# Patient Record
Sex: Male | Born: 1965 | Race: White | Hispanic: No | Marital: Married | Smoking: Never smoker
Health system: Southern US, Community
[De-identification: ages and names within clinical notes are randomized; demographics above are authoritative.]

## PROBLEM LIST (undated history)

## (undated) DIAGNOSIS — I10 Essential (primary) hypertension: Secondary | ICD-10-CM

## (undated) DIAGNOSIS — F988 Other specified behavioral and emotional disorders with onset usually occurring in childhood and adolescence: Secondary | ICD-10-CM

## (undated) DIAGNOSIS — M109 Gout, unspecified: Secondary | ICD-10-CM

---

## 2009-12-04 ENCOUNTER — Emergency Department (HOSPITAL_COMMUNITY): Admission: EM | Admit: 2009-12-04 | Discharge: 2009-12-04 | Payer: Self-pay | Admitting: Emergency Medicine

## 2010-10-04 LAB — COMPREHENSIVE METABOLIC PANEL
BUN: 21 mg/dL (ref 6–23)
CO2: 27 mEq/L (ref 19–32)
Calcium: 9.2 mg/dL (ref 8.4–10.5)
Chloride: 103 mEq/L (ref 96–112)
Potassium: 2.8 mEq/L — ABNORMAL LOW (ref 3.5–5.1)
Sodium: 139 mEq/L (ref 135–145)
Total Protein: 7.6 g/dL (ref 6.0–8.3)

## 2010-10-04 LAB — DIFFERENTIAL
Basophils Absolute: 0 10*3/uL (ref 0.0–0.1)
Basophils Relative: 0 % (ref 0–1)
Eosinophils Relative: 4 % (ref 0–5)
Lymphocytes Relative: 35 % (ref 12–46)
Lymphs Abs: 3.3 10*3/uL (ref 0.7–4.0)
Monocytes Relative: 10 % (ref 3–12)
Neutro Abs: 4.8 10*3/uL (ref 1.7–7.7)

## 2010-10-04 LAB — CBC
HCT: 41.3 % (ref 39.0–52.0)
Hemoglobin: 14.5 g/dL (ref 13.0–17.0)
MCHC: 35.1 g/dL (ref 30.0–36.0)
MCV: 88.3 fL (ref 78.0–100.0)

## 2010-10-04 LAB — URINALYSIS, ROUTINE W REFLEX MICROSCOPIC
Bilirubin Urine: NEGATIVE
Hgb urine dipstick: NEGATIVE
Nitrite: NEGATIVE
Protein, ur: NEGATIVE mg/dL
pH: 5.5 (ref 5.0–8.0)

## 2010-10-04 LAB — LIPASE, BLOOD: Lipase: 34 U/L (ref 11–59)

## 2015-10-16 ENCOUNTER — Other Ambulatory Visit: Payer: Self-pay | Admitting: Family Medicine

## 2015-10-16 ENCOUNTER — Ambulatory Visit
Admission: RE | Admit: 2015-10-16 | Discharge: 2015-10-16 | Disposition: A | Discharge status: Home / Self Care | Payer: BLUE CROSS/BLUE SHIELD | Source: Ambulatory Visit | Attending: Family Medicine | Admitting: Family Medicine

## 2015-10-16 DIAGNOSIS — N5089 Other specified disorders of the male genital organs: Secondary | ICD-10-CM

## 2015-12-10 DIAGNOSIS — F341 Dysthymic disorder: Secondary | ICD-10-CM | POA: Diagnosis not present

## 2015-12-18 DIAGNOSIS — Z79899 Other long term (current) drug therapy: Secondary | ICD-10-CM | POA: Diagnosis not present

## 2015-12-18 DIAGNOSIS — E782 Mixed hyperlipidemia: Secondary | ICD-10-CM | POA: Diagnosis not present

## 2015-12-18 DIAGNOSIS — R8299 Other abnormal findings in urine: Secondary | ICD-10-CM | POA: Diagnosis not present

## 2015-12-18 DIAGNOSIS — I10 Essential (primary) hypertension: Secondary | ICD-10-CM | POA: Diagnosis not present

## 2016-01-08 DIAGNOSIS — F341 Dysthymic disorder: Secondary | ICD-10-CM | POA: Diagnosis not present

## 2016-03-01 DIAGNOSIS — F341 Dysthymic disorder: Secondary | ICD-10-CM | POA: Diagnosis not present

## 2016-03-10 DIAGNOSIS — M109 Gout, unspecified: Secondary | ICD-10-CM | POA: Diagnosis not present

## 2016-03-10 DIAGNOSIS — M79672 Pain in left foot: Secondary | ICD-10-CM | POA: Diagnosis not present

## 2016-03-10 DIAGNOSIS — R030 Elevated blood-pressure reading, without diagnosis of hypertension: Secondary | ICD-10-CM | POA: Diagnosis not present

## 2016-03-10 DIAGNOSIS — F341 Dysthymic disorder: Secondary | ICD-10-CM | POA: Diagnosis not present

## 2016-03-22 DIAGNOSIS — F341 Dysthymic disorder: Secondary | ICD-10-CM | POA: Diagnosis not present

## 2016-04-05 DIAGNOSIS — F341 Dysthymic disorder: Secondary | ICD-10-CM | POA: Diagnosis not present

## 2016-04-07 DIAGNOSIS — F341 Dysthymic disorder: Secondary | ICD-10-CM | POA: Diagnosis not present

## 2016-04-20 DIAGNOSIS — F900 Attention-deficit hyperactivity disorder, predominantly inattentive type: Secondary | ICD-10-CM | POA: Diagnosis not present

## 2016-04-20 DIAGNOSIS — F341 Dysthymic disorder: Secondary | ICD-10-CM | POA: Diagnosis not present

## 2016-04-28 DIAGNOSIS — F900 Attention-deficit hyperactivity disorder, predominantly inattentive type: Secondary | ICD-10-CM | POA: Diagnosis not present

## 2016-04-28 DIAGNOSIS — F341 Dysthymic disorder: Secondary | ICD-10-CM | POA: Diagnosis not present

## 2016-05-04 DIAGNOSIS — F341 Dysthymic disorder: Secondary | ICD-10-CM | POA: Diagnosis not present

## 2016-05-04 DIAGNOSIS — F900 Attention-deficit hyperactivity disorder, predominantly inattentive type: Secondary | ICD-10-CM | POA: Diagnosis not present

## 2016-05-16 DIAGNOSIS — F341 Dysthymic disorder: Secondary | ICD-10-CM | POA: Diagnosis not present

## 2016-05-16 DIAGNOSIS — F419 Anxiety disorder, unspecified: Secondary | ICD-10-CM | POA: Diagnosis not present

## 2016-05-16 DIAGNOSIS — F900 Attention-deficit hyperactivity disorder, predominantly inattentive type: Secondary | ICD-10-CM | POA: Diagnosis not present

## 2016-06-01 DIAGNOSIS — F341 Dysthymic disorder: Secondary | ICD-10-CM | POA: Diagnosis not present

## 2016-06-01 DIAGNOSIS — F900 Attention-deficit hyperactivity disorder, predominantly inattentive type: Secondary | ICD-10-CM | POA: Diagnosis not present

## 2016-06-01 DIAGNOSIS — F419 Anxiety disorder, unspecified: Secondary | ICD-10-CM | POA: Diagnosis not present

## 2016-06-21 DIAGNOSIS — F419 Anxiety disorder, unspecified: Secondary | ICD-10-CM | POA: Diagnosis not present

## 2016-06-21 DIAGNOSIS — F900 Attention-deficit hyperactivity disorder, predominantly inattentive type: Secondary | ICD-10-CM | POA: Diagnosis not present

## 2016-06-23 DIAGNOSIS — F419 Anxiety disorder, unspecified: Secondary | ICD-10-CM | POA: Diagnosis not present

## 2016-06-23 DIAGNOSIS — F900 Attention-deficit hyperactivity disorder, predominantly inattentive type: Secondary | ICD-10-CM | POA: Diagnosis not present

## 2016-07-27 DIAGNOSIS — F900 Attention-deficit hyperactivity disorder, predominantly inattentive type: Secondary | ICD-10-CM | POA: Diagnosis not present

## 2016-07-27 DIAGNOSIS — F419 Anxiety disorder, unspecified: Secondary | ICD-10-CM | POA: Diagnosis not present

## 2016-10-31 IMAGING — US US SCROTUM
1 series · 14 of 25 positions shown · non-contrast
Comparison: None.

CLINICAL DATA: Palpable area superior to right testicle 1 week with
minimal discomfort.

EXAM:
ULTRASOUND OF SCROTUM
TECHNIQUE: Complete ultrasound examination of the testicles, epididymis, and
other scrotal structures was performed.

[Series 1: us scrotum · 0.06mm/px · 14 of 58 slices shown]
[im 1/58]
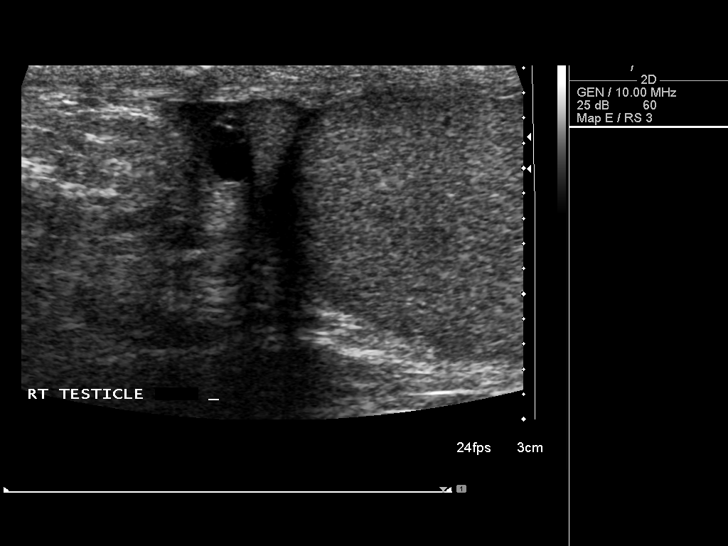
[im 5/58]
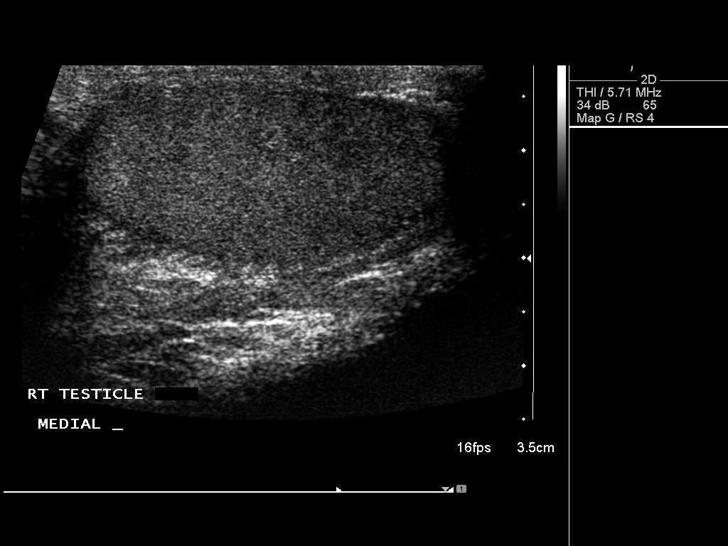
[im 10/58]
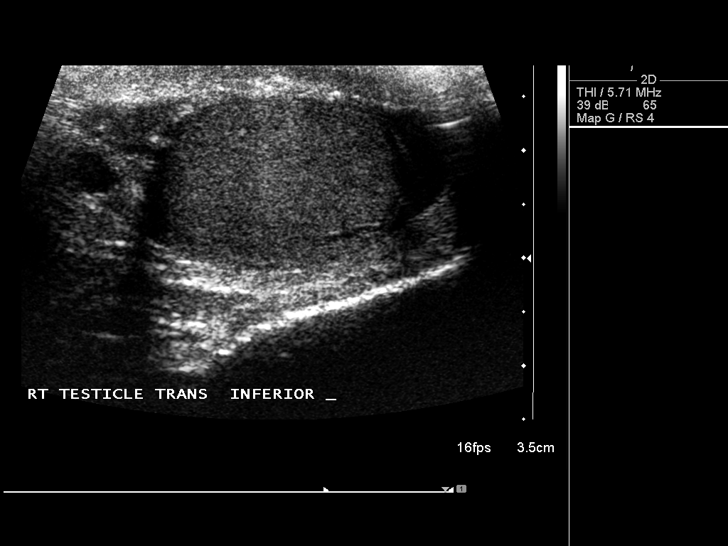
[im 15/58]
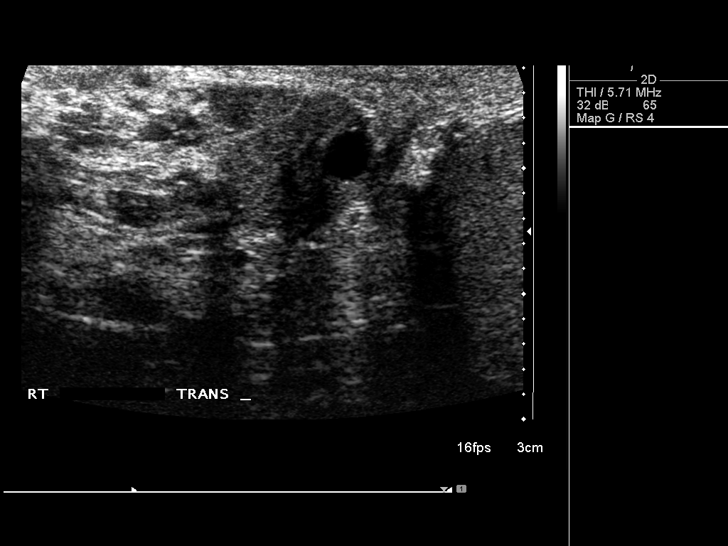
[im 20/58]
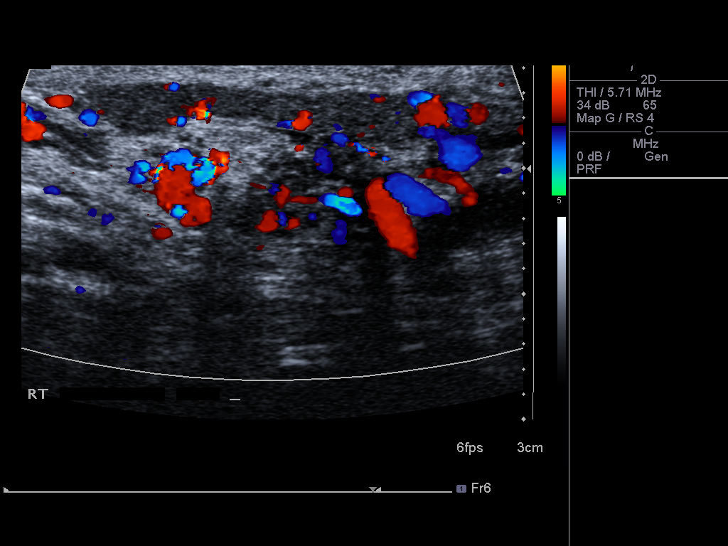
[im 22/58]
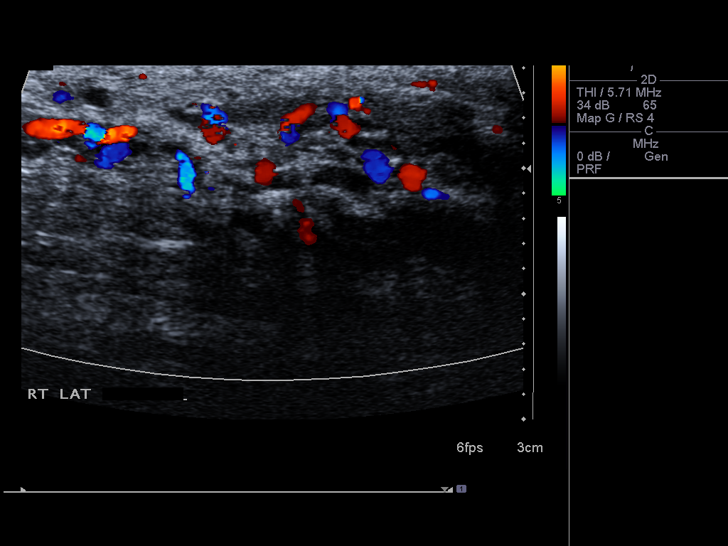
[im 27/58]
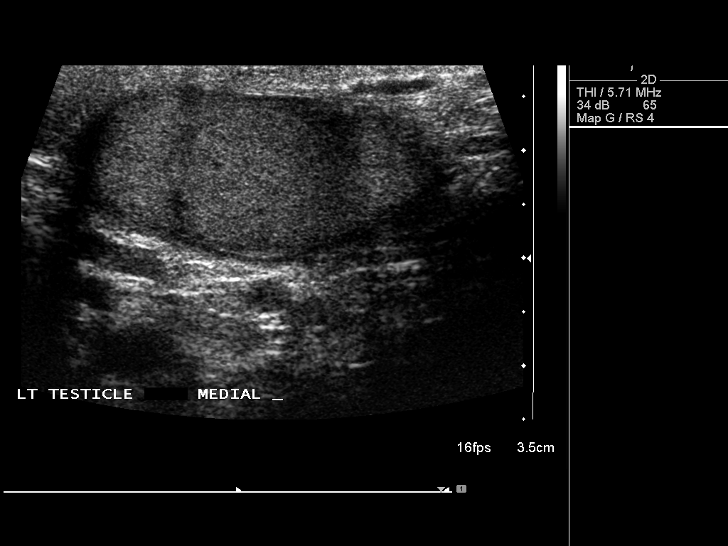
[im 31/58]
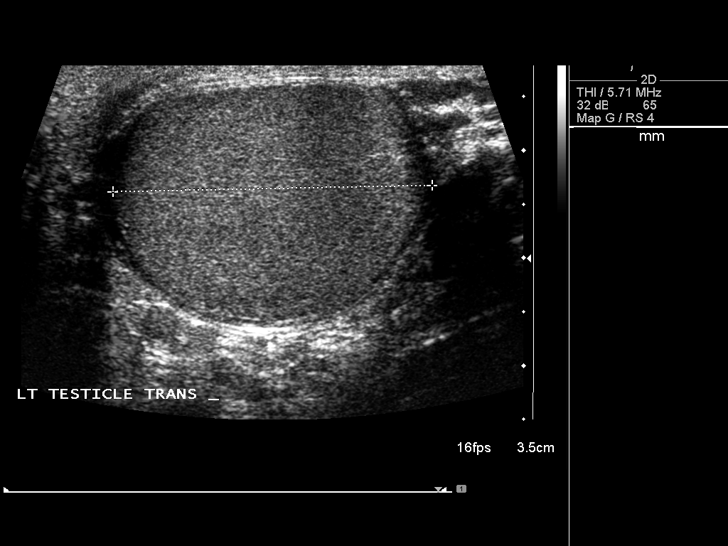
[im 36/58]
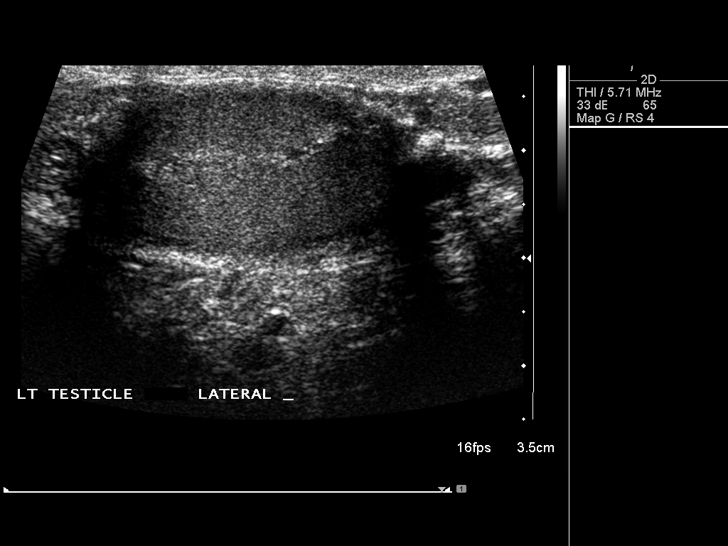
[im 39/58]
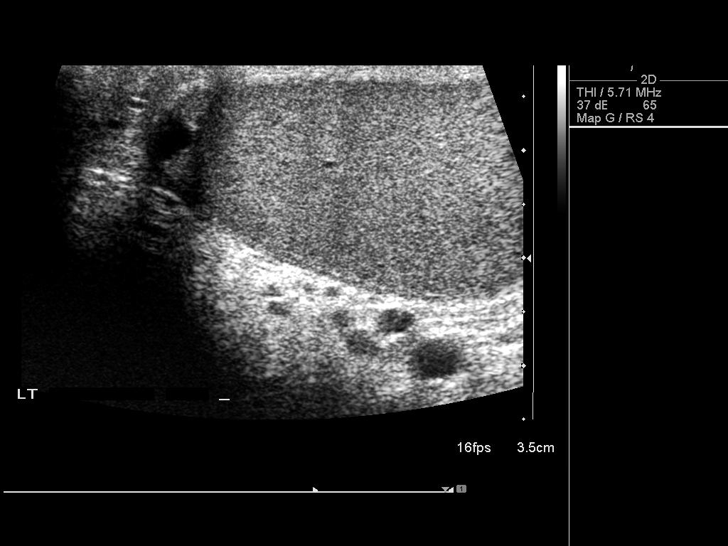
[im 43/58]
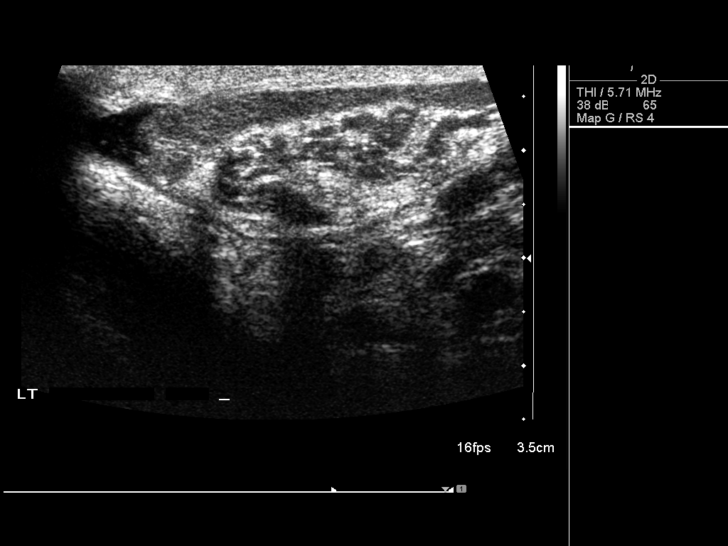
[im 48/58]
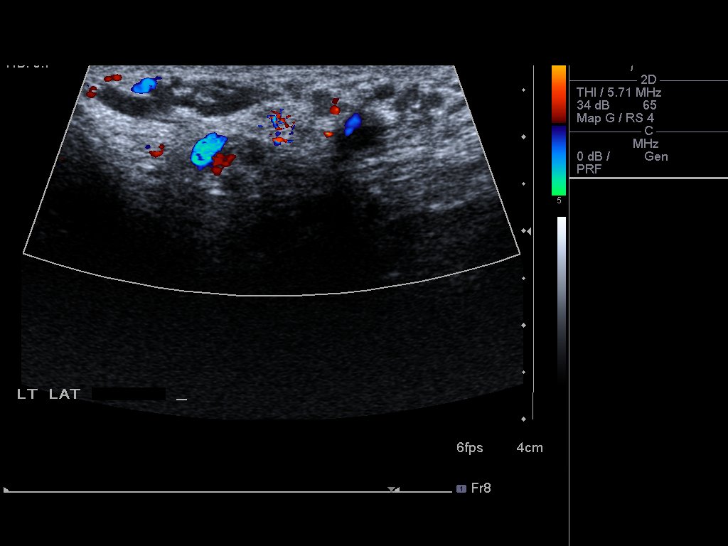
[im 53/58]
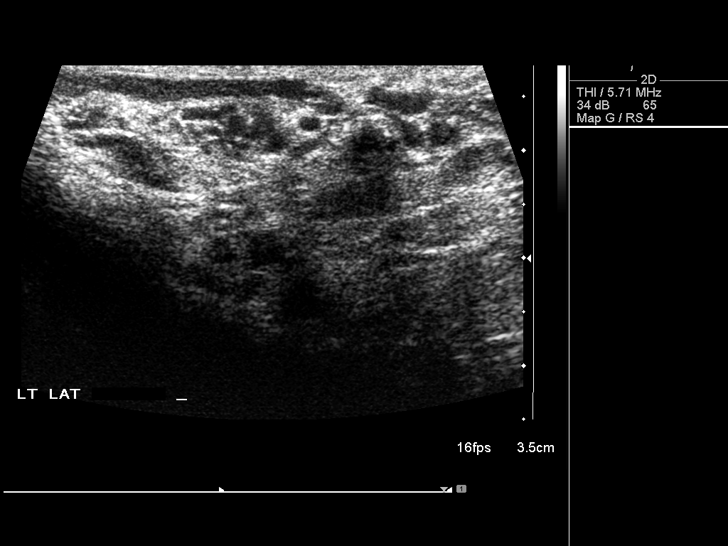
[im 58/58]
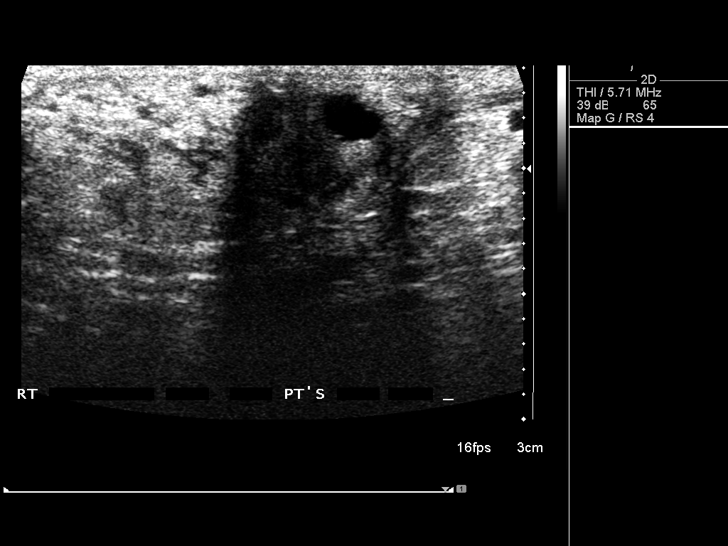

[14 of 25 positions shown; findings below may reference images not displayed]

FINDINGS: Right testicle

Measurements: 2.1 x 3.2 x 4.4 cm. No mass or microlithiasis
visualized. Normal color flow.

Left testicle

Measurements: 2.2 x 3.0 x 4.8 cm. No mass or microlithiasis
visualized. Normal color flow.

Right epididymis: 6 cm cyst versus spermatocele over the epididymal
head in the region of patient's palpable abnormality. Normal color
flow.

Left epididymis: 4.6 mm cyst versus spermatocele over the epididymal
head.

Hydrocele:  None visualized.

Varicocele:  None visualized.
IMPRESSION: Normal testicles.

6 mm epididymal cyst versus spermatocele over the right epididymal
head corresponding to the area of patient's palpable abnormality.
4.6 mm left epididymal cyst versus spermatocele.

## 2016-12-14 DIAGNOSIS — M79671 Pain in right foot: Secondary | ICD-10-CM | POA: Diagnosis not present

## 2017-08-18 DIAGNOSIS — I10 Essential (primary) hypertension: Secondary | ICD-10-CM | POA: Diagnosis not present

## 2017-08-18 DIAGNOSIS — M109 Gout, unspecified: Secondary | ICD-10-CM | POA: Diagnosis not present

## 2017-08-18 DIAGNOSIS — E782 Mixed hyperlipidemia: Secondary | ICD-10-CM | POA: Diagnosis not present

## 2017-08-18 DIAGNOSIS — G479 Sleep disorder, unspecified: Secondary | ICD-10-CM | POA: Diagnosis not present

## 2018-04-09 DIAGNOSIS — I10 Essential (primary) hypertension: Secondary | ICD-10-CM | POA: Diagnosis not present

## 2018-04-09 DIAGNOSIS — M109 Gout, unspecified: Secondary | ICD-10-CM | POA: Diagnosis not present

## 2018-04-09 DIAGNOSIS — E782 Mixed hyperlipidemia: Secondary | ICD-10-CM | POA: Diagnosis not present

## 2018-05-10 DIAGNOSIS — E782 Mixed hyperlipidemia: Secondary | ICD-10-CM | POA: Diagnosis not present

## 2018-05-10 DIAGNOSIS — M109 Gout, unspecified: Secondary | ICD-10-CM | POA: Diagnosis not present

## 2018-05-10 DIAGNOSIS — I10 Essential (primary) hypertension: Secondary | ICD-10-CM | POA: Diagnosis not present

## 2018-10-18 ENCOUNTER — Other Ambulatory Visit: Payer: Self-pay

## 2018-10-18 ENCOUNTER — Emergency Department (HOSPITAL_BASED_OUTPATIENT_CLINIC_OR_DEPARTMENT_OTHER)
Admission: EM | Admit: 2018-10-18 | Discharge: 2018-10-18 | Disposition: A | Discharge status: Home / Self Care | Payer: BLUE CROSS/BLUE SHIELD | Attending: Emergency Medicine | Admitting: Emergency Medicine

## 2018-10-18 ENCOUNTER — Emergency Department (HOSPITAL_BASED_OUTPATIENT_CLINIC_OR_DEPARTMENT_OTHER): Payer: BLUE CROSS/BLUE SHIELD

## 2018-10-18 ENCOUNTER — Encounter (HOSPITAL_BASED_OUTPATIENT_CLINIC_OR_DEPARTMENT_OTHER): Payer: Self-pay | Admitting: Emergency Medicine

## 2018-10-18 DIAGNOSIS — R079 Chest pain, unspecified: Secondary | ICD-10-CM | POA: Diagnosis not present

## 2018-10-18 DIAGNOSIS — R42 Dizziness and giddiness: Secondary | ICD-10-CM | POA: Insufficient documentation

## 2018-10-18 DIAGNOSIS — I10 Essential (primary) hypertension: Secondary | ICD-10-CM | POA: Diagnosis not present

## 2018-10-18 DIAGNOSIS — Z79899 Other long term (current) drug therapy: Secondary | ICD-10-CM | POA: Insufficient documentation

## 2018-10-18 DIAGNOSIS — R0789 Other chest pain: Secondary | ICD-10-CM | POA: Insufficient documentation

## 2018-10-18 HISTORY — DX: Essential (primary) hypertension: I10

## 2018-10-18 HISTORY — DX: Other specified behavioral and emotional disorders with onset usually occurring in childhood and adolescence: F98.8

## 2018-10-18 HISTORY — DX: Gout, unspecified: M10.9

## 2018-10-18 LAB — CBC
HCT: 44.7 % (ref 39.0–52.0)
Hemoglobin: 15.3 g/dL (ref 13.0–17.0)
MCH: 29.9 pg (ref 26.0–34.0)
MCHC: 34.2 g/dL (ref 30.0–36.0)
MCV: 87.3 fL (ref 80.0–100.0)
Platelets: 250 10*3/uL (ref 150–400)
RBC: 5.12 MIL/uL (ref 4.22–5.81)
RDW: 12.2 % (ref 11.5–15.5)
WBC: 8.6 10*3/uL (ref 4.0–10.5)
nRBC: 0 % (ref 0.0–0.2)

## 2018-10-18 LAB — BASIC METABOLIC PANEL
Anion gap: 10 (ref 5–15)
BUN: 24 mg/dL — ABNORMAL HIGH (ref 6–20)
CO2: 23 mmol/L (ref 22–32)
Calcium: 9.3 mg/dL (ref 8.9–10.3)
Chloride: 102 mmol/L (ref 98–111)
Creatinine, Ser: 1.08 mg/dL (ref 0.61–1.24)
GFR calc Af Amer: >60 mL/min (ref >60)
GFR calc non Af Amer: >60 mL/min (ref >60)
Glucose, Bld: 116 mg/dL — ABNORMAL HIGH (ref 70–99)
Potassium: 3.6 mmol/L (ref 3.5–5.1)
Sodium: 135 mmol/L (ref 135–145)

## 2018-10-18 LAB — TROPONIN I: Troponin I: <0.03 ng/mL (ref <0.03)

## 2018-10-18 LAB — D-DIMER, QUANTITATIVE (NOT AT ARMC): D-Dimer, Quant: 0.36 ug/mL-FEU (ref 0.00–0.50)

## 2018-10-18 MED ORDER — SODIUM CHLORIDE 0.9 % IV BOLUS
500.0000 mL | Freq: Once | INTRAVENOUS | Status: AC
  Administered 2018-10-18: 500 mL via INTRAVENOUS

## 2018-10-18 NOTE — ED Triage Notes (Addendum)
Had a episode of dizziness while sitting down at home . Denies n/v, concerned about elevation in usual BP , having slight chest pressure at present

## 2018-10-18 NOTE — ED Notes (Signed)
ED Provider at bedside. 

## 2018-10-18 NOTE — Discharge Instructions (Addendum)
Continue to monitor your blood pressure and keep a log twice daily when you are not stressed/in pain/exercising. See a clinician if you develop chest pain, shortness of breath or stroke symptoms. You may need adjustment of your blood pressure medication by primary doctor.

## 2018-10-18 NOTE — ED Provider Notes (Signed)
[facility] EMERGENCY DEPARTMENT Provider Note   CSN: [id number] Arrival date & time: 10/18/18  1542    History   Chief Complaint Chief Complaint  Patient presents with   Dizziness    HPI Anthony Clayton is a 53 y.o. male.     Patient with history of high blood pressure, usually compliant with his medications occasionally misses doses, gout presents after episode of dizziness/swimmy headed and transient episode of chest tightness this morning.  Dizziness has been intermittent since last night and chest tightness lasted a few seconds.  No cardiac history known.  Patient had cardiac cath done for abnormal stress test approximately 15 years ago which was unremarkable.  Patient denies any fevers, shortness of breath or cough.  Patient denies classic risk factors for pulmonary embolism.  Patient uses alcohol socially.  No cocaine or drug abuse or herbals.  Currently no symptoms.     Past Medical History:  Diagnosis Date   ADD (attention deficit disorder)    Gout    Hypertension     There are no active problems to display for this patient.   History reviewed. No pertinent surgical history.      Home Medications    Prior to Admission medications   Medication Sig Start Date End Date Taking? Authorizing Provider  aspirin 325 MG EC tablet Take 325 mg by mouth daily.   Yes [provider]  lisinopril-hydrochlorothiazide (PRINZIDE,ZESTORETIC) 20-12.5 MG tablet Take 1 tablet by mouth daily.   Yes [provider]  amLODipine (NORVASC) 10 MG tablet  07/17/18   [provider]  CONCERTA 27 MG CR tablet  08/20/18   [provider]  indomethacin (INDOCIN) 50 MG capsule TAKE AS DIRECTED FOR GOUT TWICE A DAY AS NEEDED FOR GOUT 09/24/18   [provider]    Family History No family history on file.  Social History Social History   Tobacco Use   Smoking status: Never Smoker   Smokeless tobacco: Never Used  Substance Use  Topics   Alcohol use: Yes    Comment: social   Drug use: Never     Allergies   Penicillins   Review of Systems Review of Systems  Constitutional: Negative for chills and fever.  HENT: Negative for congestion.   Eyes: Negative for visual disturbance.  Respiratory: Positive for chest tightness. Negative for shortness of breath.   Cardiovascular: Negative for chest pain.  Gastrointestinal: Negative for abdominal pain and vomiting.  Genitourinary: Negative for dysuria and flank pain.  Musculoskeletal: Negative for back pain, neck pain and neck stiffness.  Skin: Negative for rash.  Neurological: Positive for dizziness. Negative for light-headedness and headaches.     Physical Exam Updated Vital Signs BP 135/87    Pulse 94    Temp 98.3 F (36.8 C) (Oral)    Resp 20    Ht  (1.803 m)    Wt 124.7 kg    SpO2 98%    BMI 38.35 kg/m   Physical Exam Vitals signs and nursing note reviewed.  Constitutional:      Appearance: He is well-developed.  HENT:     Head: Normocephalic and atraumatic.  Eyes:     General:        Right eye: No discharge.        Left eye: No discharge.     Conjunctiva/sclera: Conjunctivae normal.  Neck:     Musculoskeletal: Normal range of motion and neck supple.     Trachea: No tracheal  deviation.  Cardiovascular:     Rate and Rhythm: Regular rhythm. Tachycardia present.  Pulmonary:     Effort: Pulmonary effort is normal.     Breath sounds: Normal breath sounds.  Abdominal:     General: There is no distension.     Palpations: Abdomen is soft.     Tenderness: There is no abdominal tenderness. There is no guarding.  Musculoskeletal:        General: No swelling or tenderness.  Skin:    General: Skin is warm.     Findings: No rash.  Neurological:     Mental Status: He is alert and oriented to person, place, and time.     GCS: GCS eye subscore is 4. GCS verbal subscore is 5. GCS motor subscore is 6.     Comments: 5+ strength in UE and LE with  f/e at major joints. Sensation to palpation intact in UE and LE. CNs 2-12 grossly intact.  EOMFI.  PERRL.   Finger nose and coordination intact bilateral.   Visual fields intact to finger testing. No nystagmus       ED Treatments / Results  Labs (all labs ordered are listed, but only abnormal results are displayed) Labs Reviewed  BASIC METABOLIC PANEL - Abnormal; Notable for the following components:      Result Value   Glucose, Bld 116 (*)    BUN 24 (*)    All other components within normal limits  CBC  TROPONIN I  D-DIMER, QUANTITATIVE (NOT AT [facility])    EKG EKG Interpretation  Date/Time:  Thursday October 18 2018 15:57:45 EDT Ventricular Rate:  108 PR Interval:    QRS Duration: 106 QT Interval:  332 QTC Calculation: 445 R Axis:   67 Text Interpretation:  Sinus tachycardia Probable left atrial enlargement Inferior infarct, age indeterminate Confirmed by Blane Ohara [phone]) on 10/18/2018 4:51:34 PM   Radiology Dg Chest Portable 1 View  Result Date: 10/18/2018 CLINICAL DATA:  Chest pain. EXAM: PORTABLE CHEST 1 VIEW COMPARISON:  Radiographs of Dec 04, 2009. FINDINGS: The heart size and mediastinal contours are within normal limits. Both lungs are clear. No pneumothorax or pleural effusion is noted. The visualized skeletal structures are unremarkable. IMPRESSION: No active disease. Electronically Signed   By: Lupita Raider, M.D.   On: 10/18/2018 17:21    Procedures Procedures (including critical care time)  Medications Ordered in ED Medications  sodium chloride 0.9 % bolus 500 mL (500 mLs Intravenous New Bag/Given 10/18/18 1635)     Initial Impression / Assessment and Plan / ED Course  I have reviewed the triage vital signs and the nursing notes.  Pertinent labs & imaging results that were available during my care of the patient were reviewed by me and considered in my medical decision making (see chart for details).       Patient with obesity and high blood  pressure history presents with intermittent dizziness and blood pressure higher than normal.  Patient states his primary doctors recommended increasing his dosage of his medication however he has declined in the past.  Patient normally runs around 130 systolic and was 962X prior to arrival and in the ER 150 systolic.  Reviewed vital signs.  Patient did have very brief episode of chest tightness.  Patient is tachycardic in the ER.  Plan for cardiac and blood clot screening with troponin, blood work, d-dimer, oral fluids and reassessment.  Patient had aspirin prior to arrival. HEAR low, WELLS low risk.   Pt  to follow up for outpatient evaluation and return for recurrent chest pain or new concerns. Blood work reviewed overall unremarkable, mild elevated BUN which would explain lightheadedness/mild dehydration which improved with IV and oral fluids.  Normal kidney function, negative troponin, negative d-dimer.  Vital signs improved in the ER, heart rate normalized and blood pressure 130s.  Patient stable for outpatient follow-up for blood pressure management with primary care doctor.  Chest x-ray reviewed no acute findings.  Results and differential diagnosis were discussed with the patient/parent/guardian. Xrays were independently reviewed by myself.  Close follow up outpatient was discussed, comfortable with the plan.   Medications  sodium chloride 0.9 % bolus 500 mL (500 mLs Intravenous New Bag/Given 10/18/18 1635)    Vitals:   10/18/18 1549 10/18/18 1600 10/18/18 1700 10/18/18 1730  BP:  (!) 158/96 (!) 143/87 135/87  Pulse:  (!) 119 95 94  Resp:  17 16 20   Temp:      TempSrc:      SpO2:  97% 96% 98%  Weight: 124.7 kg     Height: 5\' 11"  (1.803 m)       Final diagnoses:  Dizziness  Essential hypertension     Final Clinical Impressions(s) / ED Diagnoses   Final diagnoses:  Dizziness  Essential hypertension    ED Discharge Orders    None       Blane Ohara, MD 10/18/18 1806

## 2018-10-19 DIAGNOSIS — I10 Essential (primary) hypertension: Secondary | ICD-10-CM | POA: Diagnosis not present

## 2018-11-19 DIAGNOSIS — G479 Sleep disorder, unspecified: Secondary | ICD-10-CM | POA: Diagnosis not present

## 2018-11-19 DIAGNOSIS — R0681 Apnea, not elsewhere classified: Secondary | ICD-10-CM | POA: Diagnosis not present

## 2018-11-19 DIAGNOSIS — R0683 Snoring: Secondary | ICD-10-CM | POA: Diagnosis not present

## 2018-11-19 DIAGNOSIS — G478 Other sleep disorders: Secondary | ICD-10-CM | POA: Diagnosis not present

## 2018-12-11 DIAGNOSIS — G4733 Obstructive sleep apnea (adult) (pediatric): Secondary | ICD-10-CM | POA: Diagnosis not present

## 2018-12-12 DIAGNOSIS — G4733 Obstructive sleep apnea (adult) (pediatric): Secondary | ICD-10-CM | POA: Diagnosis not present

## 2018-12-24 DIAGNOSIS — G4733 Obstructive sleep apnea (adult) (pediatric): Secondary | ICD-10-CM | POA: Diagnosis not present

## 2019-01-23 DIAGNOSIS — G4733 Obstructive sleep apnea (adult) (pediatric): Secondary | ICD-10-CM | POA: Diagnosis not present

## 2019-02-11 DIAGNOSIS — G4733 Obstructive sleep apnea (adult) (pediatric): Secondary | ICD-10-CM | POA: Diagnosis not present

## 2019-02-23 DIAGNOSIS — G4733 Obstructive sleep apnea (adult) (pediatric): Secondary | ICD-10-CM | POA: Diagnosis not present

## 2019-03-11 DIAGNOSIS — M546 Pain in thoracic spine: Secondary | ICD-10-CM | POA: Diagnosis not present

## 2019-03-26 DIAGNOSIS — G4733 Obstructive sleep apnea (adult) (pediatric): Secondary | ICD-10-CM | POA: Diagnosis not present

## 2019-04-25 DIAGNOSIS — G4733 Obstructive sleep apnea (adult) (pediatric): Secondary | ICD-10-CM | POA: Diagnosis not present

## 2019-04-30 DIAGNOSIS — Z23 Encounter for immunization: Secondary | ICD-10-CM | POA: Diagnosis not present

## 2019-05-06 DIAGNOSIS — G4763 Sleep related bruxism: Secondary | ICD-10-CM | POA: Diagnosis not present

## 2019-05-06 DIAGNOSIS — G4733 Obstructive sleep apnea (adult) (pediatric): Secondary | ICD-10-CM | POA: Diagnosis not present

## 2019-05-26 DIAGNOSIS — G4733 Obstructive sleep apnea (adult) (pediatric): Secondary | ICD-10-CM | POA: Diagnosis not present

## 2019-06-10 DIAGNOSIS — G4733 Obstructive sleep apnea (adult) (pediatric): Secondary | ICD-10-CM | POA: Diagnosis not present

## 2019-06-10 DIAGNOSIS — G4763 Sleep related bruxism: Secondary | ICD-10-CM | POA: Diagnosis not present

## 2019-06-25 DIAGNOSIS — G4733 Obstructive sleep apnea (adult) (pediatric): Secondary | ICD-10-CM | POA: Diagnosis not present

## 2019-07-08 DIAGNOSIS — G4733 Obstructive sleep apnea (adult) (pediatric): Secondary | ICD-10-CM | POA: Diagnosis not present

## 2019-07-08 DIAGNOSIS — G4763 Sleep related bruxism: Secondary | ICD-10-CM | POA: Diagnosis not present

## 2019-07-26 DIAGNOSIS — G4733 Obstructive sleep apnea (adult) (pediatric): Secondary | ICD-10-CM | POA: Diagnosis not present

## 2019-08-26 DIAGNOSIS — G4733 Obstructive sleep apnea (adult) (pediatric): Secondary | ICD-10-CM | POA: Diagnosis not present

## 2019-10-09 DIAGNOSIS — G4733 Obstructive sleep apnea (adult) (pediatric): Secondary | ICD-10-CM | POA: Diagnosis not present

## 2019-10-09 DIAGNOSIS — G4763 Sleep related bruxism: Secondary | ICD-10-CM | POA: Diagnosis not present

## 2019-10-17 ENCOUNTER — Ambulatory Visit: Payer: BC Managed Care – PPO | Attending: Internal Medicine

## 2019-10-17 DIAGNOSIS — Z23 Encounter for immunization: Secondary | ICD-10-CM

## 2019-10-17 NOTE — Progress Notes (Signed)
   Covid-19 Vaccination Clinic  Name:  Anthony Clayton    MRN: [id number] DOB: 1966/04/13  10/17/2019  Mr. Anthony Clayton was observed post Covid-19 immunization for 15 minutes without incident. He was provided with Vaccine Information Sheet and instruction to access the V-Safe system.   Mr. Anthony Clayton was instructed to call 911 with any severe reactions post vaccine: Marland Kitchen Difficulty breathing  . Swelling of face and throat  . A fast heartbeat  . A bad rash all over body  . Dizziness and weakness   Immunizations Administered    Name Date Dose VIS Date Route   Pfizer COVID-19 Vaccine 10/17/2019 12:49 PM 0.3 mL 06/28/2019 Intramuscular   Manufacturer: [employer], [employer]   Lot: [id number]   NDC: [address]-4

## 2019-10-31 ENCOUNTER — Other Ambulatory Visit: Payer: Self-pay | Admitting: Family Medicine

## 2019-10-31 DIAGNOSIS — R0989 Other specified symptoms and signs involving the circulatory and respiratory systems: Secondary | ICD-10-CM

## 2019-10-31 DIAGNOSIS — Z131 Encounter for screening for diabetes mellitus: Secondary | ICD-10-CM | POA: Diagnosis not present

## 2019-10-31 DIAGNOSIS — R8290 Unspecified abnormal findings in urine: Secondary | ICD-10-CM | POA: Diagnosis not present

## 2019-10-31 DIAGNOSIS — M109 Gout, unspecified: Secondary | ICD-10-CM | POA: Diagnosis not present

## 2019-10-31 DIAGNOSIS — E782 Mixed hyperlipidemia: Secondary | ICD-10-CM | POA: Diagnosis not present

## 2019-11-07 DIAGNOSIS — H52203 Unspecified astigmatism, bilateral: Secondary | ICD-10-CM | POA: Diagnosis not present

## 2019-11-11 ENCOUNTER — Ambulatory Visit: Payer: BC Managed Care – PPO | Attending: Internal Medicine

## 2019-11-11 DIAGNOSIS — Z23 Encounter for immunization: Secondary | ICD-10-CM

## 2019-11-11 NOTE — Progress Notes (Signed)
   Covid-19 Vaccination Clinic  Name:  Anthony Clayton    MRN: [id number] DOB: January 11, 1966  11/11/2019  [name] was observed post Covid-19 immunization for 15 minutes without incident. He was provided with Vaccine Information Sheet and instruction to access the V-Safe system.   [name] was instructed to call 911 with any severe reactions post vaccine: Marland Kitchen Difficulty breathing  . Swelling of face and throat  . A fast heartbeat  . A bad rash all over body  . Dizziness and weakness   Immunizations Administered    Name Date Dose VIS Date Route   Pfizer COVID-19 Vaccine 11/11/2019 12:21 PM 0.3 mL 09/11/2018 Intramuscular   Manufacturer: [employer], [employer]   Lot: [id number]   NDC: [address]-5

## 2019-11-19 DIAGNOSIS — G4733 Obstructive sleep apnea (adult) (pediatric): Secondary | ICD-10-CM | POA: Diagnosis not present

## 2019-11-20 ENCOUNTER — Ambulatory Visit
Admission: RE | Admit: 2019-11-20 | Discharge: 2019-11-20 | Disposition: A | Discharge status: Home / Self Care | Payer: BC Managed Care – PPO | Source: Ambulatory Visit | Attending: Family Medicine | Admitting: Family Medicine

## 2019-11-20 DIAGNOSIS — R0989 Other specified symptoms and signs involving the circulatory and respiratory systems: Secondary | ICD-10-CM | POA: Diagnosis not present

## 2019-11-25 DIAGNOSIS — R899 Unspecified abnormal finding in specimens from other organs, systems and tissues: Secondary | ICD-10-CM | POA: Diagnosis not present

## 2020-02-04 DIAGNOSIS — E7800 Pure hypercholesterolemia, unspecified: Secondary | ICD-10-CM | POA: Diagnosis not present

## 2020-02-04 DIAGNOSIS — R7303 Prediabetes: Secondary | ICD-10-CM | POA: Diagnosis not present

## 2020-03-20 DIAGNOSIS — G4733 Obstructive sleep apnea (adult) (pediatric): Secondary | ICD-10-CM | POA: Diagnosis not present

## 2020-04-27 DIAGNOSIS — G4763 Sleep related bruxism: Secondary | ICD-10-CM | POA: Diagnosis not present

## 2020-04-27 DIAGNOSIS — G4733 Obstructive sleep apnea (adult) (pediatric): Secondary | ICD-10-CM | POA: Diagnosis not present

## 2020-05-15 DIAGNOSIS — E782 Mixed hyperlipidemia: Secondary | ICD-10-CM | POA: Diagnosis not present

## 2020-07-01 DIAGNOSIS — F341 Dysthymic disorder: Secondary | ICD-10-CM | POA: Diagnosis not present

## 2020-07-16 DIAGNOSIS — F341 Dysthymic disorder: Secondary | ICD-10-CM | POA: Diagnosis not present

## 2020-07-23 DIAGNOSIS — G4733 Obstructive sleep apnea (adult) (pediatric): Secondary | ICD-10-CM | POA: Diagnosis not present

## 2020-07-31 DIAGNOSIS — F341 Dysthymic disorder: Secondary | ICD-10-CM | POA: Diagnosis not present

## 2020-08-11 DIAGNOSIS — F341 Dysthymic disorder: Secondary | ICD-10-CM | POA: Diagnosis not present

## 2020-09-01 DIAGNOSIS — F341 Dysthymic disorder: Secondary | ICD-10-CM | POA: Diagnosis not present

## 2020-09-03 DIAGNOSIS — F341 Dysthymic disorder: Secondary | ICD-10-CM | POA: Diagnosis not present

## 2020-09-16 DIAGNOSIS — F341 Dysthymic disorder: Secondary | ICD-10-CM | POA: Diagnosis not present

## 2020-09-21 DIAGNOSIS — F341 Dysthymic disorder: Secondary | ICD-10-CM | POA: Diagnosis not present

## 2020-10-01 DIAGNOSIS — E7800 Pure hypercholesterolemia, unspecified: Secondary | ICD-10-CM | POA: Diagnosis not present

## 2020-10-05 DIAGNOSIS — I10 Essential (primary) hypertension: Secondary | ICD-10-CM | POA: Diagnosis not present

## 2020-10-12 DIAGNOSIS — F341 Dysthymic disorder: Secondary | ICD-10-CM | POA: Diagnosis not present

## 2020-10-15 DIAGNOSIS — F341 Dysthymic disorder: Secondary | ICD-10-CM | POA: Diagnosis not present

## 2020-10-29 DIAGNOSIS — F341 Dysthymic disorder: Secondary | ICD-10-CM | POA: Diagnosis not present

## 2020-11-09 DIAGNOSIS — F341 Dysthymic disorder: Secondary | ICD-10-CM | POA: Diagnosis not present

## 2020-11-23 DIAGNOSIS — G4763 Sleep related bruxism: Secondary | ICD-10-CM | POA: Diagnosis not present

## 2020-11-23 DIAGNOSIS — G4733 Obstructive sleep apnea (adult) (pediatric): Secondary | ICD-10-CM | POA: Diagnosis not present

## 2020-11-27 DIAGNOSIS — F341 Dysthymic disorder: Secondary | ICD-10-CM | POA: Diagnosis not present

## 2020-12-02 DIAGNOSIS — F341 Dysthymic disorder: Secondary | ICD-10-CM | POA: Diagnosis not present

## 2020-12-21 DIAGNOSIS — F341 Dysthymic disorder: Secondary | ICD-10-CM | POA: Diagnosis not present

## 2020-12-31 DIAGNOSIS — G4733 Obstructive sleep apnea (adult) (pediatric): Secondary | ICD-10-CM | POA: Diagnosis not present

## 2021-01-19 ENCOUNTER — Encounter (HOSPITAL_BASED_OUTPATIENT_CLINIC_OR_DEPARTMENT_OTHER): Payer: Self-pay

## 2021-01-19 ENCOUNTER — Emergency Department (HOSPITAL_BASED_OUTPATIENT_CLINIC_OR_DEPARTMENT_OTHER)
Admission: EM | Admit: 2021-01-19 | Discharge: 2021-01-19 | Disposition: A | Discharge status: Home / Self Care | Payer: BC Managed Care – PPO | Attending: Emergency Medicine | Admitting: Emergency Medicine

## 2021-01-19 ENCOUNTER — Other Ambulatory Visit: Payer: Self-pay

## 2021-01-19 DIAGNOSIS — Z7982 Long term (current) use of aspirin: Secondary | ICD-10-CM | POA: Insufficient documentation

## 2021-01-19 DIAGNOSIS — W228XXA Striking against or struck by other objects, initial encounter: Secondary | ICD-10-CM | POA: Insufficient documentation

## 2021-01-19 DIAGNOSIS — S0101XA Laceration without foreign body of scalp, initial encounter: Secondary | ICD-10-CM | POA: Diagnosis not present

## 2021-01-19 DIAGNOSIS — I10 Essential (primary) hypertension: Secondary | ICD-10-CM | POA: Insufficient documentation

## 2021-01-19 DIAGNOSIS — Z79899 Other long term (current) drug therapy: Secondary | ICD-10-CM | POA: Insufficient documentation

## 2021-01-19 DIAGNOSIS — R55 Syncope and collapse: Secondary | ICD-10-CM

## 2021-01-19 DIAGNOSIS — S0990XA Unspecified injury of head, initial encounter: Secondary | ICD-10-CM | POA: Diagnosis not present

## 2021-01-19 LAB — BASIC METABOLIC PANEL
Anion gap: 10 (ref 5–15)
BUN: 20 mg/dL (ref 6–20)
CO2: 28 mmol/L (ref 22–32)
Calcium: 9.5 mg/dL (ref 8.9–10.3)
Chloride: 102 mmol/L (ref 98–111)
Creatinine, Ser: 1.21 mg/dL (ref 0.61–1.24)
GFR, Estimated: >60 mL/min (ref >60)
Glucose, Bld: 111 mg/dL — ABNORMAL HIGH (ref 70–99)
Potassium: 3.2 mmol/L — ABNORMAL LOW (ref 3.5–5.1)
Sodium: 140 mmol/L (ref 135–145)

## 2021-01-19 LAB — CBC WITH DIFFERENTIAL/PLATELET
Abs Immature Granulocytes: 0.05 10*3/uL (ref 0.00–0.07)
Basophils Absolute: 0.1 10*3/uL (ref 0.0–0.1)
Basophils Relative: 1 %
Eosinophils Absolute: 0.5 10*3/uL (ref 0.0–0.5)
Eosinophils Relative: 5 %
HCT: 43.8 % (ref 39.0–52.0)
Hemoglobin: 14.9 g/dL (ref 13.0–17.0)
Immature Granulocytes: 1 %
Lymphocytes Relative: 45 %
Lymphs Abs: 4.5 10*3/uL — ABNORMAL HIGH (ref 0.7–4.0)
MCH: 29.9 pg (ref 26.0–34.0)
MCHC: 34 g/dL (ref 30.0–36.0)
MCV: 87.8 fL (ref 80.0–100.0)
Monocytes Absolute: 1.1 10*3/uL — ABNORMAL HIGH (ref 0.1–1.0)
Monocytes Relative: 11 %
Neutro Abs: 3.7 10*3/uL (ref 1.7–7.7)
Neutrophils Relative %: 37 %
Platelets: 311 10*3/uL (ref 150–400)
RBC: 4.99 MIL/uL (ref 4.22–5.81)
RDW: 12.7 % (ref 11.5–15.5)
WBC: 9.8 10*3/uL (ref 4.0–10.5)
nRBC: 0 % (ref 0.0–0.2)

## 2021-01-19 LAB — CBG MONITORING, ED: Glucose-Capillary: 112 mg/dL — ABNORMAL HIGH (ref 70–99)

## 2021-01-19 MED ORDER — SODIUM CHLORIDE 0.9 % IV SOLN: INTRAVENOUS | Status: DC

## 2021-01-19 MED ORDER — SODIUM CHLORIDE 0.9 % IV BOLUS
1000.0000 mL | Freq: Once | INTRAVENOUS | Status: AC
  Administered 2021-01-19: 1000 mL via INTRAVENOUS

## 2021-01-19 NOTE — ED Provider Notes (Signed)
[facility] EMERGENCY DEPT Provider Note  CSN: [id number] Arrival date & time: 01/19/21 0005  Chief Complaint(s) Loss of Consciousness and Head Injury  HPI Anthony Clayton is a 55 y.o. male    Loss of Consciousness Episode history:  Single Most recent episode:  Today Duration: seconds. Progression:  Resolved Chronicity:  New Context: dehydration and urination   Relieved by:  Nothing Worsened by:  Nothing Associated symptoms: no chest pain, no confusion, no dizziness, no fever, no focal sensory loss, no focal weakness, no malaise/fatigue, no rectal bleeding, no shortness of breath, no visual change and no vomiting   Head Injury Associated symptoms: no focal weakness and no vomiting    Past Medical History Past Medical History:  Diagnosis Date   ADD (attention deficit disorder)    Gout    Hypertension    There are no problems to display for this patient.  Home Medication(s) Prior to Admission medications   Medication Sig Start Date End Date Taking? Authorizing Provider  amLODipine (NORVASC) 10 MG tablet  07/17/18   [provider]  aspirin 325 MG EC tablet Take 325 mg by mouth daily.    [provider]  CONCERTA 27 MG CR tablet  08/20/18   [provider]  indomethacin (INDOCIN) 50 MG capsule TAKE AS DIRECTED FOR GOUT TWICE A DAY AS NEEDED FOR GOUT 09/24/18   [provider]  lisinopril-hydrochlorothiazide (PRINZIDE,ZESTORETIC) 20-12.5 MG tablet Take 1 tablet by mouth daily.    [provider]                                                                                                                                    Past Surgical History No past surgical history on file. Family History History reviewed. No pertinent family history.  Social History Social History   Tobacco Use   Smoking status: Never   Smokeless tobacco: Never  Substance Use Topics   Alcohol use: Yes    Comment: social   Drug use: Never    Allergies Penicillins  Review of Systems Review of Systems  Constitutional:  Negative for fever and malaise/fatigue.  Respiratory:  Negative for shortness of breath.   Cardiovascular:  Positive for syncope. Negative for chest pain.  Gastrointestinal:  Negative for vomiting.  Neurological:  Negative for dizziness and focal weakness.  Psychiatric/Behavioral:  Negative for confusion.   All other systems are reviewed and are negative for acute change except as noted in the HPI  Physical Exam Vital Signs  I have reviewed the triage vital signs BP (!) 176/85 (BP Location: Right Arm)   Pulse 96   Temp 98.3 F (36.8 C) (Oral)   Resp 18   Ht 5\' 11"  (1.803 m)   Wt 120.2 kg   SpO2 100%   BMI 36.96 kg/m   Physical Exam Vitals reviewed.  Constitutional:      General: He is not in acute distress.  Appearance: He is well-developed. He is not diaphoretic.  HENT:     Head: Normocephalic. Laceration present.      Nose: Nose normal.  Eyes:     General: No scleral icterus.       Right eye: No discharge.        Left eye: No discharge.     Conjunctiva/sclera: Conjunctivae normal.     Pupils: Pupils are equal, round, and reactive to light.  Cardiovascular:     Rate and Rhythm: Normal rate and regular rhythm.     Heart sounds: No murmur heard.   No friction rub. No gallop.  Pulmonary:     Effort: Pulmonary effort is normal. No respiratory distress.     Breath sounds: Normal breath sounds. No stridor. No rales.  Abdominal:     General: There is no distension.     Palpations: Abdomen is soft.     Tenderness: There is no abdominal tenderness.  Musculoskeletal:        General: No tenderness.     Cervical back: Normal range of motion and neck supple.  Skin:    General: Skin is warm and dry.     Findings: No erythema or rash.  Neurological:     Mental Status: He is alert and oriented to person, place, and time.    ED Results and Treatments Labs (all labs ordered are listed,  but only abnormal results are displayed) Labs Reviewed  BASIC METABOLIC PANEL - Abnormal; Notable for the following components:      Result Value   Potassium 3.2 (*)    Glucose, Bld 111 (*)    All other components within normal limits  CBC WITH DIFFERENTIAL/PLATELET - Abnormal; Notable for the following components:   Lymphs Abs 4.5 (*)    Monocytes Absolute 1.1 (*)    All other components within normal limits  CBG MONITORING, ED - Abnormal; Notable for the following components:   Glucose-Capillary 112 (*)    All other components within normal limits                                                                                                                         EKG  EKG Interpretation  Date/Time:  Tuesday January 19 2021 00:45:40 EDT Ventricular Rate:  86 PR Interval:  161 QRS Duration: 111 QT Interval:  395 QTC Calculation: 473 R Axis:   71 Text Interpretation: Sinus rhythm Probable left atrial enlargement Inferior infarct, age indeterminate No significant change since last tracing Confirmed by Drema Pry [phone]) on 01/19/2021 1:25:43 AM        Radiology No results found.  Pertinent labs & imaging results that were available during my care of the patient were reviewed by me and considered in my medical decision making (see chart for details).  Medications Ordered in ED Medications  sodium chloride 0.9 % bolus 1,000 mL (0 mLs Intravenous Stopped 01/19/21 0159)    And  0.9 %  sodium chloride infusion (  has no administration in time range)                                                                                                                                    Procedures .1-3 Lead EKG Interpretation  Date/Time: 01/19/2021 2:54 AM Performed by: Nira Conn, MD Authorized by: Nira Conn, MD     Interpretation: normal     ECG rate:  89   ECG rate assessment: normal     Rhythm: sinus rhythm     Ectopy: none     Conduction: normal    ..Laceration Repair  Date/Time: 01/19/2021 2:54 AM Performed by: Nira Conn, MD Authorized by: Nira Conn, MD   Consent:    Consent obtained:  Verbal   Consent given by:  Patient   Risks discussed:  Need for additional repair, poor cosmetic result, poor wound healing, infection and pain   Alternatives discussed:  Delayed treatment Universal protocol:    Patient identity confirmed:  Verbally with patient and arm band Anesthesia:    Anesthesia method:  None Laceration details:    Location:  Scalp   Length (cm):  3   Depth (mm):  2 Pre-procedure details:    Preparation:  Patient was prepped and draped in usual sterile fashion Exploration:    Hemostasis achieved with:  Direct pressure Treatment:    Area cleansed with:  Saline   Amount of cleaning:  Standard   Irrigation solution:  Sterile saline   Debridement:  None Skin repair:    Repair method:  Steri-Strips and tissue adhesive   Number of Steri-Strips:  3 Approximation:    Approximation:  Close Repair type:    Repair type:  Simple Post-procedure details:    Procedure completion:  Tolerated  (including critical care time)  Medical Decision Making / ED Course I have reviewed the nursing notes for this encounter and the patient's prior records (if available in EHR or on provided paperwork).   Anthony Clayton was evaluated in Emergency Department on 01/19/2021 for the symptoms described in the history of present illness. He was evaluated in the context of the global COVID-19 pandemic, which necessitated consideration that the patient might be at risk for infection with the SARS-CoV-2 virus that causes COVID-19. Institutional protocols and algorithms that pertain to the evaluation of patients at risk for COVID-19 are in a state of rapid change based on information released by regulatory bodies including the CDC and federal and state organizations. These policies and algorithms were followed during the  patient's care in the ED.  Patient presented for syncopal episode. No associated chest pain or shortness of breath.  No focal deficits.. EKG without acute ischemic changes, dysrhythmias, blocks. Orthostatics reassuring. CBC without anemia. Metabolic panel with mild hypokalemia but no other significant electrolyte derangements or renal sufficiency.  Patient is denying any headache or neck pain related to the fall.  Patient's not  anticoagulated.  Doubt ICH requiring imaging.  Laceration was irrigated and closed as above.  Patient declined tetanus booster.      Final Clinical Impression(s) / ED Diagnoses Final diagnoses:  Syncope and collapse  Laceration of scalp, initial encounter    The patient appears reasonably screened and/or stabilized for discharge and I doubt any other medical condition or other [facility] requiring further screening, evaluation, or treatment in the ED at this time prior to discharge. Safe for discharge with strict return precautions.  Disposition: Discharge  Condition: Good  I have discussed the results, Dx and Tx plan with the patient/family who expressed understanding and agree(s) with the plan. Discharge instructions discussed at length. The patient/family was given strict return precautions who verbalized understanding of the instructions. No further questions at time of discharge.    ED Discharge Orders     None        Follow Up: Juluis RainierBarnes, Elizabeth, MD [address] [place] [id number] [phone]  Call  to schedule an appointment for close follow up  [facility] [address] [place] [phone] Schedule an appointment as soon as possible for a visit  for stress testing     This chart was dictated using voice recognition software.  Despite best efforts to proofread,  errors can occur which can change the documentation meaning.    Nira Connardama,  Zymire Turnbo Eduardo, MD 01/19/21 [phone]

## 2021-01-19 NOTE — ED Triage Notes (Addendum)
Pt states pass out and hit his head 20 mins ago - sustained laceration at the back of his head - states don't recall what happened.  GCS 15. Neurologically intact.   Denies blood thinners

## 2021-01-19 NOTE — ED Notes (Signed)
ED Provider at bedside. 

## 2021-01-21 DIAGNOSIS — J029 Acute pharyngitis, unspecified: Secondary | ICD-10-CM | POA: Diagnosis not present

## 2021-01-21 DIAGNOSIS — I10 Essential (primary) hypertension: Secondary | ICD-10-CM | POA: Diagnosis not present

## 2021-01-21 DIAGNOSIS — Z03818 Encounter for observation for suspected exposure to other biological agents ruled out: Secondary | ICD-10-CM | POA: Diagnosis not present

## 2021-01-21 DIAGNOSIS — R55 Syncope and collapse: Secondary | ICD-10-CM | POA: Diagnosis not present

## 2021-01-25 DIAGNOSIS — F341 Dysthymic disorder: Secondary | ICD-10-CM | POA: Diagnosis not present

## 2021-02-02 DIAGNOSIS — L821 Other seborrheic keratosis: Secondary | ICD-10-CM | POA: Diagnosis not present

## 2021-02-02 DIAGNOSIS — L2089 Other atopic dermatitis: Secondary | ICD-10-CM | POA: Diagnosis not present

## 2021-02-02 DIAGNOSIS — L814 Other melanin hyperpigmentation: Secondary | ICD-10-CM | POA: Diagnosis not present

## 2021-02-12 DIAGNOSIS — F341 Dysthymic disorder: Secondary | ICD-10-CM | POA: Diagnosis not present

## 2021-03-18 ENCOUNTER — Other Ambulatory Visit: Payer: Self-pay

## 2021-03-18 ENCOUNTER — Encounter (HOSPITAL_BASED_OUTPATIENT_CLINIC_OR_DEPARTMENT_OTHER): Payer: Self-pay | Admitting: Cardiology

## 2021-03-18 ENCOUNTER — Ambulatory Visit (INDEPENDENT_AMBULATORY_CARE_PROVIDER_SITE_OTHER): Payer: BC Managed Care – PPO | Admitting: Cardiology

## 2021-03-18 VITALS — BP 142/84 | HR 70 | Ht 71.0 in | Wt 263.2 lb

## 2021-03-18 DIAGNOSIS — G4733 Obstructive sleep apnea (adult) (pediatric): Secondary | ICD-10-CM | POA: Diagnosis not present

## 2021-03-18 DIAGNOSIS — I10 Essential (primary) hypertension: Secondary | ICD-10-CM | POA: Diagnosis not present

## 2021-03-18 DIAGNOSIS — Z7189 Other specified counseling: Secondary | ICD-10-CM

## 2021-03-18 DIAGNOSIS — R55 Syncope and collapse: Secondary | ICD-10-CM | POA: Diagnosis not present

## 2021-03-18 DIAGNOSIS — Z9989 Dependence on other enabling machines and devices: Secondary | ICD-10-CM

## 2021-03-18 NOTE — Patient Instructions (Signed)
Medication Instructions:  Your Physician recommend you continue on your current medication as directed.    *If you need a refill on your cardiac medications before your next appointment, please call your pharmacy*   Lab Work: None ordered today   Testing/Procedures: Your physician has requested that you have an echocardiogram. Echocardiography is a painless test that uses sound waves to create images of your heart. It provides your doctor with information about the size and shape of your heart and how well your heart's chambers and valves are working. This procedure takes approximately one hour. There are no restrictions for this procedure. [address]    Follow-Up: At [facility], you and your health needs are our priority.  As part of our continuing mission to provide you with exceptional heart care, we have created designated Provider Care Teams.  These Care Teams include your primary Cardiologist (physician) and Advanced Practice Providers (APPs -  Physician Assistants and Nurse Practitioners) who all work together to provide you with the care you need, when you need it.  We recommend signing up for the patient portal called "MyChart".  Sign up information is provided on this After Visit Summary.  MyChart is used to connect with patients for Virtual Visits (Telemedicine).  Patients are able to view lab/test results, encounter notes, upcoming appointments, etc.  Non-urgent messages can be sent to your provider as well.   To learn more about what you can do with MyChart, go to [email or web address].    Your next appointment:   As needed  The format for your next appointment:   In Person  Provider:   Bridgette Christopher, MD     

## 2021-03-18 NOTE — Progress Notes (Signed)
Cardiology Office Note:    Date:  03/18/2021   ID:  Anthony Clayton, DOB Jun 09, 1966, MRN [id number]  PCP:  Juluis Rainier, MD  Cardiologist:  Jodelle Red, MD  Referring MD: Dennie Maizes, NP   CC: new patient consultation for syncope.  History of Present Illness:    Anthony Clayton is a 55 y.o. male with a hx of hypertension, gout, OSA on CPAP who is seen as a new consult at the request of Dennie Maizes, NP for the evaluation and management of syncope.  Note from 01/21/2021 reviewed. ER note also reviewed from 01/19/21.  Today: Normal day, normal eating/drinking. Around 11:30 PM, went to the bathroom (had to sit instead of stand due to gout pain), walked out towards the kitchen to get a bottle of water. Next thing, he remembers head hitting the cabinet and landed on the floor. Went to ER 01/19/21, note reviewed.  Was not straining in the bathroom. Does recall hitting the cabinet, but had no warning, does not recall most of the actual fall. Remembers wondering how he ended up on the floor. No confusion, though did ask wife several repetitive questions.  Two days later, was urinating (sitting again due to gout pain), felt dizzy, slid down to the floor, did not lose consciousness. Had another episode of dizziness while driving, passed on its own. Had another time when he felt lightheaded after being in the car for a while.  Has gout, flared in late June, usually treats with indomethacin and/or colchicine. Had been taking colchicine for days with this severe flare, continued for about two months. Was not having diarrhea with the colchicine. Was eating/drinking normally.   We discussed syncope today, what causes it, workup, recommendations.   Denies chest pain, shortness of breath at rest or with normal exertion. No PND, orthopnea, LE edema or unexpected weight gain. No palpitations.   Typically very active at baseline, participates in MMA/krav maga. Has not done recently due to his  recent gout flare.  Past Medical History:  Diagnosis Date   ADD (attention deficit disorder)    Gout    Hypertension     History reviewed. No pertinent surgical history.  Current Medications: Current Outpatient Medications on File Prior to Visit  Medication Sig   allopurinol (ZYLOPRIM) 100 MG tablet Take 100 mg by mouth 2 (two) times daily.   amLODipine (NORVASC) 10 MG tablet    clonazePAM (KLONOPIN) 1 MG tablet Take 1 mg by mouth at bedtime.   cloNIDine (CATAPRES) 0.1 MG tablet Take 0.2 mg by mouth daily.   Colchicine 0.6 MG CAPS    indomethacin (INDOCIN) 50 MG capsule TAKE AS DIRECTED FOR GOUT TWICE A DAY AS NEEDED FOR GOUT   lisinopril (ZESTRIL) 40 MG tablet    CONCERTA 27 MG CR tablet  (Patient not taking: Reported on 03/18/2021)   No current facility-administered medications on file prior to visit.     Allergies:   Penicillins   Social History   Tobacco Use   Smoking status: Never   Smokeless tobacco: Never  Substance Use Topics   Alcohol use: Yes    Comment: social   Drug use: Never    Family History: family history includes Hypertension in his mother; Multiple sclerosis in his sister. No history of sudden cardiac death.  ROS:   Please see the history of present illness.  Additional pertinent ROS: Constitutional: Negative for chills, fever, night sweats, unintentional weight loss  HENT: Negative for ear pain and hearing loss.  Eyes: Negative for loss of vision and eye pain.  Respiratory: Negative for cough, sputum, wheezing.   Cardiovascular: See HPI. Gastrointestinal: Negative for abdominal pain, melena, and hematochezia.  Genitourinary: Negative for dysuria and hematuria.  Musculoskeletal: Negative for myalgias.  Skin: Negative for itching and rash.  Neurological: Syncope as above. History of prior neuropathy Endo/Heme/Allergies: Does not bruise/bleed easily.    EKGs/Labs/Other Studies Reviewed:    The following studies were reviewed today: ABI  11/20/2019 Resting ABI the bilateral lower extremities within normal limits with the segmental exam demonstrating no significant arterial occlusive disease  Heart cath 06/24/2004 (per notes) Normal coronary angiography  EKG:  EKG is personally reviewed.   03/18/21 NSR 70 bpm  Recent Labs: 01/19/2021: BUN 20; Creatinine, Ser 1.21; Hemoglobin 14.9; Platelets 311; Potassium 3.2; Sodium 140  Recent Lipid Panel No results found for: CHOL, TRIG, HDL, CHOLHDL, VLDL, LDLCALC, LDLDIRECT  Physical Exam:    VS:  BP (!) 142/84   Pulse 70   Ht 5\' 11"  (1.803 m)   Wt 263 lb 3.2 oz (119.4 kg)   SpO2 99%   BMI 36.71 kg/m     Wt Readings from Last 3 Encounters:  03/18/21 263 lb 3.2 oz (119.4 kg)  01/19/21 265 lb (120.2 kg)  10/18/18 275 lb (124.7 kg)    GEN: Well nourished, well developed in no acute distress HEENT: Normal, moist mucous membranes NECK: No JVD CARDIAC: regular rhythm, normal S1 and S2, no rubs or gallops. No murmur. VASCULAR: Radial and DP pulses 2+ bilaterally. No carotid bruits RESPIRATORY:  Clear to auscultation without rales, wheezing or rhonchi  ABDOMEN: Soft, non-tender, non-distended MUSCULOSKELETAL:  Ambulates independently SKIN: Warm and dry, no edema NEUROLOGIC:  Alert and oriented x 3. No focal neuro deficits noted. PSYCHIATRIC:  Normal affect    ASSESSMENT:    1. Syncope and collapse   2. Primary hypertension   3. OSA on CPAP   4. Cardiac risk counseling   5. Counseling on health promotion and disease prevention    PLAN:    Syncope Dizziness -we discussed the multiple possible etiologies of syncope, including both cardiac and noncardiac causes -we discussed high risk features to watch for; none noted on interview today -we discussed guideline recommended evaluation. ECG today unremarkable, will order echocardiogram to rule out structural heart disease -if no clear cardiac or neurologic etiology found, we discussed reflex syncope and management strategies   -I did discuss the [place] DMV medical guidelines for driving: "it is prudent to recommend that all persons should be free of syncopal episodes for at least six months to be granted the driving privilege." (THE [place] PHYSICIAN'S GUIDE TO DRIVER MEDICAL EVALUATION, Second Edition, Medical Review Branch, 12/18/18, Division of [employer], [employer] of Transportation, July 2004)   Hypertension -on amlodipine 10 mg, lisinopril 40 mg, clonidine 0.2 mg daily (has been on clonidine since 2019--only at night for jaw clenching per report). Denies waking up with elevated blood pressure or symptoms -above goal today, but reports this is usually well controlled.  OSA on CPAP  Cardiac risk counseling and prevention recommendations: -recommend heart healthy/Mediterranean diet, with whole grains, fruits, vegetable, fish, lean meats, nuts, and olive oil. Limit salt. -recommend moderate walking, 3-5 times/week for 30-50 minutes each session. Aim for at least 150 minutes.week. Goal should be pace of 3 miles/hours, or walking 1.5 miles in 30 minutes -recommend avoidance of tobacco products. Avoid excess alcohol.  Plan for follow up: if echo unremarkable, follow  up as needed  Jodelle Red, MD, PhD, [facility] [place]  [facility]    Medication Adjustments/Labs and Tests Ordered: Current medicines are reviewed at length with the patient today.  Concerns regarding medicines are outlined above.  Orders Placed This Encounter  Procedures   EKG 12-Lead   ECHOCARDIOGRAM COMPLETE    No orders of the defined types were placed in this encounter.   Patient Instructions  Medication Instructions:  Your Physician recommend you continue on your current medication as directed.    *If you need a refill on your cardiac medications before your next appointment, please call your pharmacy*   Lab Work: None ordered today   Testing/Procedures: Your physician has  requested that you have an echocardiogram. Echocardiography is a painless test that uses sound waves to create images of your heart. It provides your doctor with information about the size and shape of your heart and how well your heart's chambers and valves are working. This procedure takes approximately one hour. There are no restrictions for this procedure. [address]    Follow-Up: At [facility], you and your health needs are our priority.  As part of our continuing mission to provide you with exceptional heart care, we have created designated Provider Care Teams.  These Care Teams include your primary Cardiologist (physician) and Advanced Practice Providers (APPs -  Physician Assistants and Nurse Practitioners) who all work together to provide you with the care you need, when you need it.  We recommend signing up for the patient portal called "MyChart".  Sign up information is provided on this After Visit Summary.  MyChart is used to connect with patients for Virtual Visits (Telemedicine).  Patients are able to view lab/test results, encounter notes, upcoming appointments, etc.  Non-urgent messages can be sent to your provider as well.   To learn more about what you can do with MyChart, go to [email or web address].    Your next appointment:   As needed  The format for your next appointment:   In Person  Provider:   Jodelle Red, MD    Signed, Jodelle Red, MD PhD 03/18/2021 9:36 AM    [facility]

## 2021-03-24 ENCOUNTER — Ambulatory Visit (HOSPITAL_BASED_OUTPATIENT_CLINIC_OR_DEPARTMENT_OTHER): Payer: BC Managed Care – PPO | Attending: Cardiology

## 2021-03-24 ENCOUNTER — Other Ambulatory Visit: Payer: Self-pay

## 2021-03-24 DIAGNOSIS — R55 Syncope and collapse: Secondary | ICD-10-CM

## 2021-03-24 LAB — ECHOCARDIOGRAM COMPLETE
Area-P 1/2: 3.3 cm2
S' Lateral: 3.04 cm

## 2021-03-25 DIAGNOSIS — F341 Dysthymic disorder: Secondary | ICD-10-CM | POA: Diagnosis not present

## 2021-03-26 ENCOUNTER — Encounter (HOSPITAL_BASED_OUTPATIENT_CLINIC_OR_DEPARTMENT_OTHER): Payer: Self-pay

## 2021-04-02 ENCOUNTER — Encounter (HOSPITAL_BASED_OUTPATIENT_CLINIC_OR_DEPARTMENT_OTHER): Payer: Self-pay

## 2021-05-05 DIAGNOSIS — F341 Dysthymic disorder: Secondary | ICD-10-CM | POA: Diagnosis not present

## 2021-05-20 DIAGNOSIS — F341 Dysthymic disorder: Secondary | ICD-10-CM | POA: Diagnosis not present

## 2021-05-25 DIAGNOSIS — F341 Dysthymic disorder: Secondary | ICD-10-CM | POA: Diagnosis not present

## 2021-05-27 DIAGNOSIS — G4733 Obstructive sleep apnea (adult) (pediatric): Secondary | ICD-10-CM | POA: Diagnosis not present

## 2021-06-01 DIAGNOSIS — I10 Essential (primary) hypertension: Secondary | ICD-10-CM | POA: Diagnosis not present

## 2021-06-01 DIAGNOSIS — M109 Gout, unspecified: Secondary | ICD-10-CM | POA: Diagnosis not present

## 2021-06-01 DIAGNOSIS — Z23 Encounter for immunization: Secondary | ICD-10-CM | POA: Diagnosis not present

## 2021-06-01 DIAGNOSIS — F4321 Adjustment disorder with depressed mood: Secondary | ICD-10-CM | POA: Diagnosis not present

## 2021-06-04 DIAGNOSIS — F341 Dysthymic disorder: Secondary | ICD-10-CM | POA: Diagnosis not present

## 2021-06-24 DIAGNOSIS — K621 Rectal polyp: Secondary | ICD-10-CM | POA: Diagnosis not present

## 2021-06-24 DIAGNOSIS — K648 Other hemorrhoids: Secondary | ICD-10-CM | POA: Diagnosis not present

## 2021-06-24 DIAGNOSIS — Z1211 Encounter for screening for malignant neoplasm of colon: Secondary | ICD-10-CM | POA: Diagnosis not present

## 2021-06-24 DIAGNOSIS — K5730 Diverticulosis of large intestine without perforation or abscess without bleeding: Secondary | ICD-10-CM | POA: Diagnosis not present

## 2021-06-24 DIAGNOSIS — D122 Benign neoplasm of ascending colon: Secondary | ICD-10-CM | POA: Diagnosis not present

## 2021-06-24 DIAGNOSIS — D123 Benign neoplasm of transverse colon: Secondary | ICD-10-CM | POA: Diagnosis not present

## 2021-06-25 DIAGNOSIS — F341 Dysthymic disorder: Secondary | ICD-10-CM | POA: Diagnosis not present

## 2021-06-26 DIAGNOSIS — G4733 Obstructive sleep apnea (adult) (pediatric): Secondary | ICD-10-CM | POA: Diagnosis not present

## 2021-07-09 DIAGNOSIS — F341 Dysthymic disorder: Secondary | ICD-10-CM | POA: Diagnosis not present

## 2021-07-14 DIAGNOSIS — G4733 Obstructive sleep apnea (adult) (pediatric): Secondary | ICD-10-CM | POA: Diagnosis not present

## 2021-07-14 DIAGNOSIS — G4763 Sleep related bruxism: Secondary | ICD-10-CM | POA: Diagnosis not present

## 2021-07-14 DIAGNOSIS — F4321 Adjustment disorder with depressed mood: Secondary | ICD-10-CM | POA: Diagnosis not present

## 2021-07-20 DIAGNOSIS — F4323 Adjustment disorder with mixed anxiety and depressed mood: Secondary | ICD-10-CM | POA: Diagnosis not present

## 2021-07-26 DIAGNOSIS — F4312 Post-traumatic stress disorder, chronic: Secondary | ICD-10-CM | POA: Diagnosis not present

## 2021-07-27 DIAGNOSIS — G4733 Obstructive sleep apnea (adult) (pediatric): Secondary | ICD-10-CM | POA: Diagnosis not present

## 2021-08-03 DIAGNOSIS — F4323 Adjustment disorder with mixed anxiety and depressed mood: Secondary | ICD-10-CM | POA: Diagnosis not present

## 2021-08-11 DIAGNOSIS — F4323 Adjustment disorder with mixed anxiety and depressed mood: Secondary | ICD-10-CM | POA: Diagnosis not present

## 2021-08-17 DIAGNOSIS — F4312 Post-traumatic stress disorder, chronic: Secondary | ICD-10-CM | POA: Diagnosis not present

## 2021-08-25 DIAGNOSIS — F4323 Adjustment disorder with mixed anxiety and depressed mood: Secondary | ICD-10-CM | POA: Diagnosis not present

## 2021-08-31 DIAGNOSIS — F4312 Post-traumatic stress disorder, chronic: Secondary | ICD-10-CM | POA: Diagnosis not present

## 2021-09-02 DIAGNOSIS — Z23 Encounter for immunization: Secondary | ICD-10-CM | POA: Diagnosis not present

## 2021-09-15 DIAGNOSIS — F4312 Post-traumatic stress disorder, chronic: Secondary | ICD-10-CM | POA: Diagnosis not present

## 2021-09-16 DIAGNOSIS — F4323 Adjustment disorder with mixed anxiety and depressed mood: Secondary | ICD-10-CM | POA: Diagnosis not present

## 2021-10-14 DIAGNOSIS — F4312 Post-traumatic stress disorder, chronic: Secondary | ICD-10-CM | POA: Diagnosis not present

## 2021-11-03 DIAGNOSIS — F4321 Adjustment disorder with depressed mood: Secondary | ICD-10-CM | POA: Diagnosis not present

## 2021-11-03 DIAGNOSIS — G4733 Obstructive sleep apnea (adult) (pediatric): Secondary | ICD-10-CM | POA: Diagnosis not present

## 2021-11-11 DIAGNOSIS — F4312 Post-traumatic stress disorder, chronic: Secondary | ICD-10-CM | POA: Diagnosis not present

## 2021-11-23 DIAGNOSIS — R3989 Other symptoms and signs involving the genitourinary system: Secondary | ICD-10-CM | POA: Diagnosis not present

## 2021-11-23 DIAGNOSIS — Z0000 Encounter for general adult medical examination without abnormal findings: Secondary | ICD-10-CM | POA: Diagnosis not present

## 2021-11-23 DIAGNOSIS — I10 Essential (primary) hypertension: Secondary | ICD-10-CM | POA: Diagnosis not present

## 2021-11-23 DIAGNOSIS — M109 Gout, unspecified: Secondary | ICD-10-CM | POA: Diagnosis not present

## 2021-11-23 DIAGNOSIS — R7303 Prediabetes: Secondary | ICD-10-CM | POA: Diagnosis not present

## 2021-11-23 DIAGNOSIS — E782 Mixed hyperlipidemia: Secondary | ICD-10-CM | POA: Diagnosis not present

## 2021-11-23 DIAGNOSIS — Z125 Encounter for screening for malignant neoplasm of prostate: Secondary | ICD-10-CM | POA: Diagnosis not present

## 2021-11-25 DIAGNOSIS — F4312 Post-traumatic stress disorder, chronic: Secondary | ICD-10-CM | POA: Diagnosis not present

## 2021-11-30 ENCOUNTER — Other Ambulatory Visit: Payer: Self-pay | Admitting: Family Medicine

## 2021-11-30 ENCOUNTER — Other Ambulatory Visit (HOSPITAL_BASED_OUTPATIENT_CLINIC_OR_DEPARTMENT_OTHER): Payer: Self-pay | Admitting: Family Medicine

## 2021-11-30 DIAGNOSIS — E782 Mixed hyperlipidemia: Secondary | ICD-10-CM

## 2021-12-02 ENCOUNTER — Other Ambulatory Visit (HOSPITAL_BASED_OUTPATIENT_CLINIC_OR_DEPARTMENT_OTHER): Payer: BC Managed Care – PPO

## 2021-12-03 ENCOUNTER — Ambulatory Visit (HOSPITAL_BASED_OUTPATIENT_CLINIC_OR_DEPARTMENT_OTHER)
Admission: RE | Admit: 2021-12-03 | Discharge: 2021-12-03 | Disposition: A | Discharge status: Home / Self Care | Payer: BC Managed Care – PPO | Source: Ambulatory Visit | Attending: Family Medicine | Admitting: Family Medicine

## 2021-12-03 DIAGNOSIS — E782 Mixed hyperlipidemia: Secondary | ICD-10-CM

## 2021-12-03 DIAGNOSIS — G4733 Obstructive sleep apnea (adult) (pediatric): Secondary | ICD-10-CM | POA: Diagnosis not present

## 2021-12-09 DIAGNOSIS — F4312 Post-traumatic stress disorder, chronic: Secondary | ICD-10-CM | POA: Diagnosis not present

## 2021-12-24 DIAGNOSIS — F4312 Post-traumatic stress disorder, chronic: Secondary | ICD-10-CM | POA: Diagnosis not present

## 2022-01-03 DIAGNOSIS — G4733 Obstructive sleep apnea (adult) (pediatric): Secondary | ICD-10-CM | POA: Diagnosis not present

## 2022-01-07 DIAGNOSIS — F4312 Post-traumatic stress disorder, chronic: Secondary | ICD-10-CM | POA: Diagnosis not present

## 2022-01-20 DIAGNOSIS — E785 Hyperlipidemia, unspecified: Secondary | ICD-10-CM | POA: Diagnosis not present

## 2022-01-20 DIAGNOSIS — R3989 Other symptoms and signs involving the genitourinary system: Secondary | ICD-10-CM | POA: Diagnosis not present

## 2022-01-28 DIAGNOSIS — F4312 Post-traumatic stress disorder, chronic: Secondary | ICD-10-CM | POA: Diagnosis not present

## 2022-02-16 DIAGNOSIS — F4312 Post-traumatic stress disorder, chronic: Secondary | ICD-10-CM | POA: Diagnosis not present

## 2022-03-08 DIAGNOSIS — F4312 Post-traumatic stress disorder, chronic: Secondary | ICD-10-CM | POA: Diagnosis not present

## 2022-04-12 DIAGNOSIS — F4312 Post-traumatic stress disorder, chronic: Secondary | ICD-10-CM | POA: Diagnosis not present

## 2022-04-18 DIAGNOSIS — G4733 Obstructive sleep apnea (adult) (pediatric): Secondary | ICD-10-CM | POA: Diagnosis not present

## 2022-04-18 DIAGNOSIS — G4763 Sleep related bruxism: Secondary | ICD-10-CM | POA: Diagnosis not present

## 2022-04-19 DIAGNOSIS — E782 Mixed hyperlipidemia: Secondary | ICD-10-CM | POA: Diagnosis not present

## 2022-05-06 DIAGNOSIS — N50811 Right testicular pain: Secondary | ICD-10-CM | POA: Diagnosis not present

## 2022-05-06 DIAGNOSIS — N529 Male erectile dysfunction, unspecified: Secondary | ICD-10-CM | POA: Diagnosis not present

## 2022-05-10 DIAGNOSIS — F4312 Post-traumatic stress disorder, chronic: Secondary | ICD-10-CM | POA: Diagnosis not present

## 2022-06-07 DIAGNOSIS — F4312 Post-traumatic stress disorder, chronic: Secondary | ICD-10-CM | POA: Diagnosis not present

## 2022-08-02 DIAGNOSIS — F4312 Post-traumatic stress disorder, chronic: Secondary | ICD-10-CM | POA: Diagnosis not present

## 2022-09-13 DIAGNOSIS — F4312 Post-traumatic stress disorder, chronic: Secondary | ICD-10-CM | POA: Diagnosis not present

## 2022-09-19 DIAGNOSIS — N529 Male erectile dysfunction, unspecified: Secondary | ICD-10-CM | POA: Diagnosis not present

## 2022-09-19 DIAGNOSIS — N50811 Right testicular pain: Secondary | ICD-10-CM | POA: Diagnosis not present

## 2022-10-17 DIAGNOSIS — G4733 Obstructive sleep apnea (adult) (pediatric): Secondary | ICD-10-CM | POA: Diagnosis not present

## 2022-10-17 DIAGNOSIS — G4763 Sleep related bruxism: Secondary | ICD-10-CM | POA: Diagnosis not present

## 2022-10-18 DIAGNOSIS — F4312 Post-traumatic stress disorder, chronic: Secondary | ICD-10-CM | POA: Diagnosis not present

## 2022-10-31 DIAGNOSIS — G4733 Obstructive sleep apnea (adult) (pediatric): Secondary | ICD-10-CM | POA: Diagnosis not present

## 2022-11-01 NOTE — Progress Notes (Signed)
Cardiology Office Note:    Date:  11/02/2022   ID:  Anthony Clayton, DOB 21-Oct-1965, MRN [id number]  PCP:  Shon Hale, MD  Cardiologist:  Jodelle Red, MD  CC: follow up  History of Present Illness:    Anthony Clayton is a 57 y.o. male with a hx of hypertension, gout, OSA on CPAP who is seen for follow up. I initially met him 03/18/21 as a new consult at the request of Dr. Eudelia Bunch for the evaluation and management of syncope.  Today, he complains of episodes of chest tightness/pressure with exertion beginning a few months ago. His first episode occurred while moving boxes to the attic. His next episode happened about a month later when moving heavy furniture. These episodes tend to last about 5-6 minutes. Taking deep breaths tends to help alleviate his symptoms a bit. He denies any pain or shortness of breath.   He reports he has not gone to the gym since being sick in 05/2022, which he believes was Covid. He has been avoiding strenuous activity until getting checked at this visit.   He has been managing his gout well. He recently had a gout flare up with left foot/ankle tenderness. He took 0.1 mg Clonidine and 100 mg Allopurinol.   We discussed his previous stress test and cath.  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 It was not as severe when he would regularly go the gym.   He denies any palpitations, shortness of breath, or peripheral edema. No lightheadedness, headaches, syncope, orthopnea, or PND.  Past Medical History:  Diagnosis Date   ADD (attention deficit disorder)    Gout    Hypertension     History reviewed. No pertinent surgical history.  Current Medications: Current Outpatient Medications on File Prior to Visit  Medication Sig   allopurinol (ZYLOPRIM) 100 MG tablet Take 100 mg by mouth as needed.   amLODipine (NORVASC) 10 MG tablet Take 10 mg by mouth daily.   atorvastatin (LIPITOR) 20 MG tablet Take 20 mg by mouth daily.    clonazePAM (KLONOPIN) 1 MG tablet Take 1 mg by mouth at bedtime.   cloNIDine (CATAPRES) 0.1 MG tablet Pt take 0.1 mg(1 tablet) in the morning and 0.2 mg (2 tablet at bedtime   Colchicine 0.6 MG CAPS Take 0.6 mg by mouth as needed.   CONCERTA 27 MG CR tablet Take 27 mg by mouth as needed.   indomethacin (INDOCIN) 50 MG capsule TAKE AS DIRECTED FOR GOUT TWICE A DAY AS NEEDED FOR GOUT   lisinopril (ZESTRIL) 40 MG tablet Take 40 mg by mouth daily.   tadalafil (CIALIS) 20 MG tablet Take 20 mg by mouth daily as needed for erectile dysfunction.   No current facility-administered medications on file prior to visit.     Allergies:   Penicillins   Social History   Tobacco Use   Smoking status: Never   Smokeless tobacco: Never  Substance Use Topics   Alcohol use: Yes    Comment: social   Drug use: Never    Family History: family history includes Hypertension in his mother; Multiple sclerosis in his sister. No history of sudden cardiac death.  ROS:   Please see the history of present illness.  (+) chest tightness/pressure Additional pertinent ROS otherwise unremarkable  EKGs/Labs/Other Studies Reviewed:    The following studies were reviewed today:  Coronary Calcium Score: FINDINGS: Coronary arteries: Normal origins. Coronary Calcium Score: Left main: 0 Left anterior descending artery:  8.82 Left circumflex artery: 6.09 Right coronary artery: 0 Total: 14.9 Percentile: 62nd Pericardium: Normal. Aorta: Normal caliber. Non-cardiac: See separate report from [facility].   IMPRESSION: Coronary calcium score of 14.9. This was 62nd percentile for age-, race-, and sex-matched controls.  Echo 03/24/2021: IMPRESSIONS   1. Left ventricular ejection fraction, by estimation, is 60 to 65%. Left  ventricular ejection fraction by 3D volume is 65 %. The left ventricle has  normal function. The left ventricle has no regional wall motion  abnormalities. Left ventricular diastolic    parameters were normal. The average left ventricular global longitudinal  strain is -18.9 %. The global longitudinal strain is normal.   2. Right ventricular systolic function is normal. The right ventricular  size is normal.   3. The mitral valve is normal in structure. No evidence of mitral valve  regurgitation. No evidence of mitral stenosis.   4. The aortic valve is normal in structure. Aortic valve regurgitation is  not visualized. No aortic stenosis is present.   5. The inferior vena cava is normal in size with greater than 50%  respiratory variability, suggesting right atrial pressure of 3 mmHg.   ABI 11/20/2019 Resting ABI the bilateral lower extremities within normal limits with the segmental exam demonstrating no significant arterial occlusive disease  Heart cath 06/24/2004 (per notes) Normal coronary angiography  EKG:  EKG is personally reviewed.   11/02/2022: Sinus rhythm at 70 bpm 03/18/21 NSR 70 bpm  Recent Labs: No results found for requested labs within last 365 days.  Recent Lipid Panel No results found for: "CHOL", "TRIG", "HDL", "CHOLHDL", "VLDL", "LDLCALC", "LDLDIRECT"  Physical Exam:    VS:  BP 130/86 (BP Location: Left Arm, Patient Position: Sitting, Cuff Size: Large)   Pulse 70   Ht 5\' 11"  (1.803 m)   Wt 253 lb 6.4 oz (114.9 kg)   BMI 35.34 kg/m     Wt Readings from Last 3 Encounters:  11/02/22 253 lb 6.4 oz (114.9 kg)  03/18/21 263 lb 3.2 oz (119.4 kg)  01/19/21 265 lb (120.2 kg)    GEN: Well nourished, well developed in no acute distress HEENT: Normal, moist mucous membranes NECK: No JVD CARDIAC: regular rhythm, normal S1 and S2, no rubs or gallops. No murmur. VASCULAR: Radial and DP pulses 2+ bilaterally. No carotid bruits RESPIRATORY:  Clear to auscultation without rales, wheezing or rhonchi  ABDOMEN: Soft, non-tender, non-distended MUSCULOSKELETAL:  Ambulates independently SKIN: Warm and dry, no edema NEUROLOGIC:  Alert and oriented x 3. No  focal neuro deficits noted. PSYCHIATRIC:  Normal affect    ASSESSMENT:    1. Chest pain of uncertain etiology   2. Nonocclusive coronary atherosclerosis of native coronary artery   3. Primary hypertension   4. Cardiac risk counseling   5. Counseling on health promotion and disease prevention     PLAN:    Chest pain -cardiac CTA, with metoprolol premedication -reviewed red flag warning signs that need immediate medical attention  Hypertension -on amlodipine, lisinopril, clonidine (for jaw clenching per report). Denies waking up with elevated blood pressure or symptoms -above goal today, but reports this is usually well controlled.  OSA on CPAP  Cardiac risk counseling and prevention recommendations: -recommend heart healthy/Mediterranean diet, with whole grains, fruits, vegetable, fish, lean meats, nuts, and olive oil. Limit salt. -recommend moderate walking, 3-5 times/week for 30-50 minutes each session. Aim for at least 150 minutes.week. Goal should be pace of 3 miles/hours, or walking 1.5 miles in 30 minutes -recommend avoidance  of tobacco products. Avoid excess alcohol.  Plan for follow up: 1 year, or sooner depending on results of CT  Jodelle Red, MD, PhD, [facility]  [facility]    Medication Adjustments/Labs and Tests Ordered: Current medicines are reviewed at length with the patient today.  Concerns regarding medicines are outlined above.  Orders Placed This Encounter  Procedures   CT CORONARY MORPH W/CTA COR W/SCORE W/CA W/CM &/OR WO/CM   Basic metabolic panel   EKG 12-Lead   Meds ordered this encounter  Medications   metoprolol tartrate (LOPRESSOR) 25 MG tablet    Sig: Take 1 tablet (25 mg) TWO hours prior to CT scan    Dispense:  1 tablet    Refill:  0   Patient Instructions  Medication Instructions:  TAKE METOPROLOL 25 MG 1 TABLET 2 HOURS PRIOR TO CARDIAC CT   NO CIALIS 3 DAYS PRIOR TO CT   *If you need a refill on your cardiac  medications before your next appointment, please call your pharmacy*  Lab Work: BMET TODAY   If you have labs (blood work) drawn today and your tests are completely normal, you will receive your results only by: MyChart Message (if you have MyChart) OR A paper copy in the mail If you have any lab test that is abnormal or we need to change your treatment, we will call you to review the results.   Testing/Procedures: Your physician has requested that you have cardiac CT. Cardiac computed tomography (CT) is a painless test that uses an x-ray machine to take clear, detailed pictures of your heart. For further information please visit [email or web address] Please follow instruction sheet as given.  Follow-Up: At [facility], you and your health needs are our priority.  As part of our continuing mission to provide you with exceptional heart care, we have created designated Provider Care Teams.  These Care Teams include your primary Cardiologist (physician) and Advanced Practice Providers (APPs -  Physician Assistants and Nurse Practitioners) who all work together to provide you with the care you need, when you need it.  We recommend signing up for the patient portal called "MyChart".  Sign up information is provided on this After Visit Summary.  MyChart is used to connect with patients for Virtual Visits (Telemedicine).  Patients are able to view lab/test results, encounter notes, upcoming appointments, etc.  Non-urgent messages can be sent to your provider as well.   To learn more about what you can do with MyChart, go to [email or web address].    Your next appointment:   12 month(s)  Provider:   Jodelle Red, MD    Other Instructions   Your cardiac CT will be scheduled at one of the below locations:   [facility] [address] [place], [place] [address] [phone]  OR  [facility] [address] [place], [place] [address] [phone]  OR   [facility] [address] [place], [place] [address] [phone]  If scheduled at [facility], please arrive at the [facility] and Children's Entrance (Entrance C2) of [facility] 30 minutes prior to test start time. You can use the FREE valet parking offered at entrance C (encouraged to control the heart rate for the test)  Proceed to the [facility] Radiology Department (first floor) to check-in and test prep.  All radiology patients and guests should use entrance C2 at [facility], accessed from [facility],  even though the hospital's physical address listed is [address].    If scheduled at [facility] or [facility], please arrive 15 mins early for check-in and test prep.  Please follow these instructions carefully (unless otherwise directed):  Hold all erectile dysfunction medications at least 3 days (72 hrs) prior to test. (Ie viagra, cialis, sildenafil, tadalafil, etc) We will administer nitroglycerin during this exam.   On the Night Before the Test: Be sure to Drink plenty of water. Do not consume any caffeinated/decaffeinated beverages or chocolate 12 hours prior to your test. Do not take any antihistamines 12 hours prior to your test.  On the Day of the Test: Drink plenty of water until 1 hour prior to the test. Do not eat any food 1 hour prior to test. You may take your regular medications prior to the test.  Take metoprolol (Lopressor) two hours prior to test. If you take Furosemide/Hydrochlorothiazide/Spironolactone, please HOLD on the morning of the test.      After the Test: Drink plenty of water. After receiving IV contrast, you may experience a mild flushed feeling. This is normal. On occasion, you may experience a mild rash up to 24 hours after the test. This is not dangerous. If this occurs, you  can take Benadryl 25 mg and increase your fluid intake. If you experience trouble breathing, this can be serious. If it is severe call 911 IMMEDIATELY. If it is mild, please call our office. If you take any of these medications: Glipizide/Metformin, Avandament, Glucavance, please do not take 48 hours after completing test unless otherwise instructed.  We will call to schedule your test 2-4 weeks out understanding that some insurance companies will need an authorization prior to the service being performed.   For non-scheduling related questions, please contact the cardiac imaging nurse navigator should you have any questions/concerns: Rockwell Alexandria, Cardiac Imaging Nurse Navigator Larey Brick, Cardiac Imaging Nurse Navigator [facility] Direct Office Dial: [phone]   For scheduling needs, including cancellations and rescheduling, please call [place], [phone].  Cardiac CT Angiogram A cardiac CT angiogram is a procedure to look at the heart and the area around the heart. It may be done to help find the cause of chest pains or other symptoms of heart disease. During this procedure, a substance called contrast dye is injected into a vein in the arm. The contrast highlights the blood vessels in the area to be checked. A large X-ray machine (CT scanner), then takes detailed pictures of the heart and the surrounding area. The procedure is also sometimes called a coronary CT angiogram, coronary artery scanning, or CTA. A cardiac CT angiogram allows the health care provider to see how well blood is flowing to and from the heart. The provider will be able to see if there are any problems, such as: Blockage or narrowing of the arteries in the heart. Fluid around the heart. Signs of weakness or disease in the muscles, valves, and tissues of the heart. Tell a health care provider about: Any allergies you have. This is especially important if you have had a previous  allergic reaction to medicines, contrast dye, or iodine. All medicines you are taking, including vitamins, herbs, eye drops, creams, and over-the-counter medicines. Any bleeding problems you have. Any surgeries you have had. Any medical conditions you have, including kidney problems or kidney failure. Whether you are pregnant or may be pregnant. Any anxiety disorders, chronic pain, or other conditions  you have. These may increase your stress or prevent you from lying still. Any history of abnormal heart rhythms or heart procedures. What are the risks? Your provider will talk with you about risks. These may include: Bleeding. Infection. Allergic reactions to medicines or dyes. Damage to other structures or organs. Kidney damage from the contrast dye. Increased risk of cancer from radiation exposure. This risk is low. Talk with your provider about: The risks and benefits of testing. How you can receive the lowest dose of radiation. What happens before the procedure? Wear comfortable clothing and remove any jewelry, glasses, dentures, and hearing aids. Follow instructions from your provider about eating and drinking. These may include: 12 hours before the procedure Avoid caffeine. This includes tea, coffee, soda, energy drinks, and diet pills. Drink plenty of water or other fluids that do not have caffeine in them. Being well hydrated can prevent complications. 4-6 hours before the procedure Stop eating and drinking. This will reduce the risk of nausea from the contrast dye. Ask your provider about changing or stopping your regular medicines. These include: Diabetes medicines. Medicines to treat problems with erections (erectile dysfunction). If you have kidney problems, you may need to receive IV hydration before and after the test. What happens during the procedure?  Hair on your chest may need to be removed so that small sticky patches called electrodes can be placed on your chest.  These will transmit information that helps to monitor your heart during the procedure. An IV will be inserted into one of your veins. You might be given a medicine to control your heart rate during the procedure. This will help to ensure that good images are obtained. You will be asked to lie on an exam table. This table will slide in and out of the CT machine during the procedure. Contrast dye will be injected into the IV. You might feel warm, or you may get a metallic taste in your mouth. You may be given medicines to relax or dilate the arteries in your heart. If you are allergic to contrast dyes or iodine you may be given medicine before the test to reduce the risk of an allergic reaction. The table that you are lying on will move into the CT machine tunnel for the scan. The person running the machine will give you instructions while the scans are being done. You may be asked to: Keep your arms above your head. Hold your breath for short periods. Stay very still, even if the table is moving. The procedure may vary among providers and hospitals. What can I expect after the procedure? After your procedure, it is common to have: A metallic taste in your mouth from the contrast dye. A feeling of warmth. A headache from the heart medicine. Follow these instructions at home: Take over-the-counter and prescription medicines only as told by your provider. If you are told, drink enough fluid to keep your pee pale yellow. This will help to flush the contrast dye out of your body. Most people can return to their normal activities right after the procedure. Ask your provider what activities are safe for you. It is up to you to get the results of your procedure. Ask your provider, or the department that is doing the procedure, when your results will be ready. Contact a health care provider if: You have any symptoms of allergy to the contrast dye. These include: Shortness of breath. Rash or hives. A  racing heartbeat. You notice a change in your  peeing (urination). This information is not intended to replace advice given to you by your health care provider. Make sure you discuss any questions you have with your health care provider. Document Revised: 02/04/2022 Document Reviewed: 02/04/2022 Elsevier Patient Education  2023 Elsevier Inc.    I,Rachel Rivera,acting as a Neurosurgeon for [employer], MD.,have documented all relevant documentation on the behalf of Jodelle Red, MD,as directed by  Jodelle Red, MD while in the presence of Jodelle Red, MD.  I, Jodelle Red, MD, have reviewed all documentation for this visit. The documentation on 12/28/22 for the exam, diagnosis, procedures, and orders are all accurate and complete.  Signed, Jodelle Red, MD PhD 11/02/2022 10:50 AM    [facility]

## 2022-11-02 ENCOUNTER — Encounter (HOSPITAL_BASED_OUTPATIENT_CLINIC_OR_DEPARTMENT_OTHER): Payer: Self-pay | Admitting: Cardiology

## 2022-11-02 ENCOUNTER — Ambulatory Visit (INDEPENDENT_AMBULATORY_CARE_PROVIDER_SITE_OTHER): Payer: BC Managed Care – PPO | Admitting: Cardiology

## 2022-11-02 VITALS — BP 130/86 | HR 70 | Ht 71.0 in | Wt 253.4 lb

## 2022-11-02 DIAGNOSIS — I2510 Atherosclerotic heart disease of native coronary artery without angina pectoris: Secondary | ICD-10-CM | POA: Diagnosis not present

## 2022-11-02 DIAGNOSIS — R079 Chest pain, unspecified: Secondary | ICD-10-CM

## 2022-11-02 DIAGNOSIS — Z7189 Other specified counseling: Secondary | ICD-10-CM

## 2022-11-02 DIAGNOSIS — I10 Essential (primary) hypertension: Secondary | ICD-10-CM | POA: Diagnosis not present

## 2022-11-02 LAB — BASIC METABOLIC PANEL
BUN/Creatinine Ratio: 18 (ref 9–20)
BUN: 22 mg/dL (ref 6–24)
CO2: 24 mmol/L (ref 20–29)
Calcium: 9.9 mg/dL (ref 8.7–10.2)
Chloride: 102 mmol/L (ref 96–106)
Creatinine, Ser: 1.2 mg/dL (ref 0.76–1.27)
Glucose: 102 mg/dL — ABNORMAL HIGH (ref 70–99)
Potassium: 4.5 mmol/L (ref 3.5–5.2)
Sodium: 140 mmol/L (ref 134–144)
eGFR: 72 mL/min/{1.73_m2} (ref >59)

## 2022-11-02 MED ORDER — METOPROLOL TARTRATE 25 MG PO TABS: ORAL_TABLET | ORAL | 0 refills | Status: AC

## 2022-11-02 NOTE — Patient Instructions (Addendum)
Medication Instructions:  TAKE METOPROLOL 25 MG 1 TABLET 2 HOURS PRIOR TO CARDIAC CT   NO CIALIS 3 DAYS PRIOR TO CT   *If you need a refill on your cardiac medications before your next appointment, please call your pharmacy*  Lab Work: BMET TODAY   If you have labs (blood work) drawn today and your tests are completely normal, you will receive your results only by: MyChart Message (if you have MyChart) OR A paper copy in the mail If you have any lab test that is abnormal or we need to change your treatment, we will call you to review the results.   Testing/Procedures: Your physician has requested that you have cardiac CT. Cardiac computed tomography (CT) is a painless test that uses an x-ray machine to take clear, detailed pictures of your heart. For further information please visit [email or web address] Please follow instruction sheet as given.  Follow-Up: At [facility], you and your health needs are our priority.  As part of our continuing mission to provide you with exceptional heart care, we have created designated Provider Care Teams.  These Care Teams include your primary Cardiologist (physician) and Advanced Practice Providers (APPs -  Physician Assistants and Nurse Practitioners) who all work together to provide you with the care you need, when you need it.  We recommend signing up for the patient portal called "MyChart".  Sign up information is provided on this After Visit Summary.  MyChart is used to connect with patients for Virtual Visits (Telemedicine).  Patients are able to view lab/test results, encounter notes, upcoming appointments, etc.  Non-urgent messages can be sent to your provider as well.   To learn more about what you can do with MyChart, go to [email or web address].    Your next appointment:   12 month(s)  Provider:   Jodelle Red, MD    Other Instructions   Your cardiac CT will be scheduled at one of the below locations:   [facility] [address] [place], [place] [address] [phone]  OR  [facility] [address] [place], [place] [address] [phone]  OR   [facility] [address] [place], [place] [address] [phone]  If scheduled at [facility], please arrive at the [facility] and Children's Entrance (Entrance C2) of [facility] 30 minutes prior to test start time. You can use the FREE valet parking offered at entrance C (encouraged to control the heart rate for the test)  Proceed to the [facility] Radiology Department (first floor) to check-in and test prep.  All radiology patients and guests should use entrance C2 at [facility], accessed from [facility], even though the hospital's physical address listed is [address].    If scheduled at [facility] or [facility], please arrive 15 mins early for check-in and test prep.  Please follow these instructions carefully (unless otherwise directed):  Hold all erectile dysfunction medications at least 3 days (72 hrs) prior to test. (Ie viagra, cialis, sildenafil, tadalafil, etc) We will administer nitroglycerin during this exam.   On the Night Before the Test: Be sure to Drink plenty of water. Do not consume any caffeinated/decaffeinated beverages or chocolate 12 hours prior to your test. Do not take any antihistamines 12 hours prior to your test.  On the Day of the Test: Drink plenty of water until 1 hour prior to the test. Do not  eat any food 1 hour prior to test. You may take your regular medications prior to the test.  Take metoprolol (Lopressor) two hours prior to test. If you take Furosemide/Hydrochlorothiazide/Spironolactone, please HOLD on the morning of the test.      After the Test: Drink plenty of water. After receiving IV contrast, you may experience a  mild flushed feeling. This is normal. On occasion, you may experience a mild rash up to 24 hours after the test. This is not dangerous. If this occurs, you can take Benadryl 25 mg and increase your fluid intake. If you experience trouble breathing, this can be serious. If it is severe call 911 IMMEDIATELY. If it is mild, please call our office. If you take any of these medications: Glipizide/Metformin, Avandament, Glucavance, please do not take 48 hours after completing test unless otherwise instructed.  We will call to schedule your test 2-4 weeks out understanding that some insurance companies will need an authorization prior to the service being performed.   For non-scheduling related questions, please contact the cardiac imaging nurse navigator should you have any questions/concerns: Rockwell Alexandria, Cardiac Imaging Nurse Navigator Larey Brick, Cardiac Imaging Nurse Navigator [facility] Direct Office Dial: [phone]   For scheduling needs, including cancellations and rescheduling, please call [place], [phone].  Cardiac CT Angiogram A cardiac CT angiogram is a procedure to look at the heart and the area around the heart. It may be done to help find the cause of chest pains or other symptoms of heart disease. During this procedure, a substance called contrast dye is injected into a vein in the arm. The contrast highlights the blood vessels in the area to be checked. A large X-ray machine (CT scanner), then takes detailed pictures of the heart and the surrounding area. The procedure is also sometimes called a coronary CT angiogram, coronary artery scanning, or CTA. A cardiac CT angiogram allows the health care provider to see how well blood is flowing to and from the heart. The provider will be able to see if there are any problems, such as: Blockage or narrowing of the arteries in the heart. Fluid around the heart. Signs of weakness or disease in the  muscles, valves, and tissues of the heart. Tell a health care provider about: Any allergies you have. This is especially important if you have had a previous allergic reaction to medicines, contrast dye, or iodine. All medicines you are taking, including vitamins, herbs, eye drops, creams, and over-the-counter medicines. Any bleeding problems you have. Any surgeries you have had. Any medical conditions you have, including kidney problems or kidney failure. Whether you are pregnant or may be pregnant. Any anxiety disorders, chronic pain, or other conditions you have. These may increase your stress or prevent you from lying still. Any history of abnormal heart rhythms or heart procedures. What are the risks? Your provider will talk with you about risks. These may include: Bleeding. Infection. Allergic reactions to medicines or dyes. Damage to other structures or organs. Kidney damage from the contrast dye. Increased risk of cancer from radiation exposure. This risk is low. Talk with your provider about: The risks and benefits of testing. How you can receive the lowest dose of radiation. What happens before the procedure? Wear comfortable clothing and remove any jewelry, glasses, dentures, and hearing aids. Follow instructions from your provider about eating and drinking. These may include: 12 hours before the procedure Avoid caffeine. This includes tea, coffee, soda, energy drinks, and diet pills.  Drink plenty of water or other fluids that do not have caffeine in them. Being well hydrated can prevent complications. 4-6 hours before the procedure Stop eating and drinking. This will reduce the risk of nausea from the contrast dye. Ask your provider about changing or stopping your regular medicines. These include: Diabetes medicines. Medicines to treat problems with erections (erectile dysfunction). If you have kidney problems, you may need to receive IV hydration before and after the  test. What happens during the procedure?  Hair on your chest may need to be removed so that small sticky patches called electrodes can be placed on your chest. These will transmit information that helps to monitor your heart during the procedure. An IV will be inserted into one of your veins. You might be given a medicine to control your heart rate during the procedure. This will help to ensure that good images are obtained. You will be asked to lie on an exam table. This table will slide in and out of the CT machine during the procedure. Contrast dye will be injected into the IV. You might feel warm, or you may get a metallic taste in your mouth. You may be given medicines to relax or dilate the arteries in your heart. If you are allergic to contrast dyes or iodine you may be given medicine before the test to reduce the risk of an allergic reaction. The table that you are lying on will move into the CT machine tunnel for the scan. The person running the machine will give you instructions while the scans are being done. You may be asked to: Keep your arms above your head. Hold your breath for short periods. Stay very still, even if the table is moving. The procedure may vary among providers and hospitals. What can I expect after the procedure? After your procedure, it is common to have: A metallic taste in your mouth from the contrast dye. A feeling of warmth. A headache from the heart medicine. Follow these instructions at home: Take over-the-counter and prescription medicines only as told by your provider. If you are told, drink enough fluid to keep your pee pale yellow. This will help to flush the contrast dye out of your body. Most people can return to their normal activities right after the procedure. Ask your provider what activities are safe for you. It is up to you to get the results of your procedure. Ask your provider, or the department that is doing the procedure, when your  results will be ready. Contact a health care provider if: You have any symptoms of allergy to the contrast dye. These include: Shortness of breath. Rash or hives. A racing heartbeat. You notice a change in your peeing (urination). This information is not intended to replace advice given to you by your health care provider. Make sure you discuss any questions you have with your health care provider. Document Revised: 02/04/2022 Document Reviewed: 02/04/2022 Elsevier Patient Education  2023 [employer].

## 2022-11-09 ENCOUNTER — Telehealth (HOSPITAL_COMMUNITY): Payer: Self-pay | Admitting: Emergency Medicine

## 2022-11-09 NOTE — Telephone Encounter (Signed)
Reaching out to patient to offer assistance regarding upcoming cardiac imaging study; pt verbalizes understanding of appt date/time, parking situation and where to check in, pre-test NPO status and medications ordered, and verified current allergies; name and call back number provided for further questions should they arise Rockwell Alexandria RN Navigator Cardiac Imaging [facility] [phone] office [phone] cell   metoprolol tartrate Denies iv issues Aware contrast Rosana Fret

## 2022-11-10 ENCOUNTER — Ambulatory Visit (HOSPITAL_COMMUNITY)
Admission: RE | Admit: 2022-11-10 | Discharge: 2022-11-10 | Disposition: A | Discharge status: Home / Self Care | Payer: BC Managed Care – PPO | Source: Ambulatory Visit | Attending: Cardiology | Admitting: Cardiology

## 2022-11-10 DIAGNOSIS — I10 Essential (primary) hypertension: Secondary | ICD-10-CM | POA: Insufficient documentation

## 2022-11-10 DIAGNOSIS — R079 Chest pain, unspecified: Secondary | ICD-10-CM | POA: Diagnosis not present

## 2022-11-10 DIAGNOSIS — I2510 Atherosclerotic heart disease of native coronary artery without angina pectoris: Secondary | ICD-10-CM | POA: Diagnosis not present

## 2022-11-10 MED ORDER — IOHEXOL 350 MG/ML SOLN
100.0000 mL | Freq: Once | INTRAVENOUS | Status: AC | PRN
  Administered 2022-11-10: 100 mL via INTRAVENOUS

## 2022-11-10 MED ORDER — NITROGLYCERIN 0.4 MG SL SUBL
SUBLINGUAL_TABLET | SUBLINGUAL | Status: AC
  Filled 2022-11-10: qty 2

## 2022-11-10 MED ORDER — NITROGLYCERIN 0.4 MG SL SUBL
0.8000 mg | SUBLINGUAL_TABLET | Freq: Once | SUBLINGUAL | Status: AC
  Administered 2022-11-10: 0.8 mg via SUBLINGUAL

## 2022-11-11 ENCOUNTER — Telehealth (HOSPITAL_BASED_OUTPATIENT_CLINIC_OR_DEPARTMENT_OTHER): Payer: Self-pay

## 2022-11-11 MED ORDER — ATORVASTATIN CALCIUM 40 MG PO TABS: 40.0000 mg | ORAL_TABLET | Freq: Every day | ORAL | 3 refills | Status: DC

## 2022-11-11 NOTE — Addendum Note (Signed)
Addended by: Marlene Lard on: 11/11/2022 11:54 AM   Modules accepted: Orders

## 2022-11-11 NOTE — Telephone Encounter (Addendum)
Seen by patient Anthony Clayton on 11/10/2022  9:06 PM, follow up mychart message sent to patient.    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 This means there is plaque but not enough to cause symptoms. Recommend aggressive secondary prevention.   Start Aspirin EC 81mg  daily.   04/2022 LDL 101 with LDL goal now <09. Recommend increase Atorvastatin to 40mg  daily. Repeat FLP/LFT in 2 months.   If he prefers, can follow up with Dr. Cristal Deer in 3-6 months instead of one year.

## 2022-11-30 DIAGNOSIS — G4733 Obstructive sleep apnea (adult) (pediatric): Secondary | ICD-10-CM | POA: Diagnosis not present

## 2022-12-06 DIAGNOSIS — Z0000 Encounter for general adult medical examination without abnormal findings: Secondary | ICD-10-CM | POA: Diagnosis not present

## 2022-12-31 DIAGNOSIS — G4733 Obstructive sleep apnea (adult) (pediatric): Secondary | ICD-10-CM | POA: Diagnosis not present

## 2023-01-06 DIAGNOSIS — Z125 Encounter for screening for malignant neoplasm of prostate: Secondary | ICD-10-CM | POA: Diagnosis not present

## 2023-01-06 DIAGNOSIS — R7303 Prediabetes: Secondary | ICD-10-CM | POA: Diagnosis not present

## 2023-01-06 DIAGNOSIS — I10 Essential (primary) hypertension: Secondary | ICD-10-CM | POA: Diagnosis not present

## 2023-01-06 DIAGNOSIS — E782 Mixed hyperlipidemia: Secondary | ICD-10-CM | POA: Diagnosis not present

## 2023-03-13 DIAGNOSIS — G4733 Obstructive sleep apnea (adult) (pediatric): Secondary | ICD-10-CM | POA: Diagnosis not present

## 2023-03-24 DIAGNOSIS — M25432 Effusion, left wrist: Secondary | ICD-10-CM | POA: Diagnosis not present

## 2023-03-24 DIAGNOSIS — M25632 Stiffness of left wrist, not elsewhere classified: Secondary | ICD-10-CM | POA: Diagnosis not present

## 2023-03-24 DIAGNOSIS — M25532 Pain in left wrist: Secondary | ICD-10-CM | POA: Diagnosis not present

## 2023-03-29 ENCOUNTER — Other Ambulatory Visit: Payer: Self-pay | Admitting: Family Medicine

## 2023-03-29 ENCOUNTER — Ambulatory Visit
Admission: RE | Admit: 2023-03-29 | Discharge: 2023-03-29 | Disposition: A | Discharge status: Home / Self Care | Payer: BC Managed Care – PPO | Source: Ambulatory Visit | Attending: Family Medicine | Admitting: Family Medicine

## 2023-03-29 DIAGNOSIS — M25532 Pain in left wrist: Secondary | ICD-10-CM | POA: Diagnosis not present

## 2023-03-29 DIAGNOSIS — M19042 Primary osteoarthritis, left hand: Secondary | ICD-10-CM | POA: Diagnosis not present

## 2023-04-13 DIAGNOSIS — G4733 Obstructive sleep apnea (adult) (pediatric): Secondary | ICD-10-CM | POA: Diagnosis not present

## 2023-04-26 DIAGNOSIS — G4733 Obstructive sleep apnea (adult) (pediatric): Secondary | ICD-10-CM | POA: Diagnosis not present

## 2023-04-26 DIAGNOSIS — G4763 Sleep related bruxism: Secondary | ICD-10-CM | POA: Diagnosis not present

## 2023-05-13 DIAGNOSIS — G4733 Obstructive sleep apnea (adult) (pediatric): Secondary | ICD-10-CM | POA: Diagnosis not present

## 2023-05-17 DIAGNOSIS — M25532 Pain in left wrist: Secondary | ICD-10-CM | POA: Diagnosis not present

## 2023-06-08 DIAGNOSIS — I10 Essential (primary) hypertension: Secondary | ICD-10-CM | POA: Diagnosis not present

## 2023-06-08 DIAGNOSIS — E782 Mixed hyperlipidemia: Secondary | ICD-10-CM | POA: Diagnosis not present

## 2023-06-08 DIAGNOSIS — R7303 Prediabetes: Secondary | ICD-10-CM | POA: Diagnosis not present

## 2023-06-08 DIAGNOSIS — M109 Gout, unspecified: Secondary | ICD-10-CM | POA: Diagnosis not present

## 2023-06-23 DIAGNOSIS — H52203 Unspecified astigmatism, bilateral: Secondary | ICD-10-CM | POA: Diagnosis not present

## 2023-06-23 DIAGNOSIS — H04123 Dry eye syndrome of bilateral lacrimal glands: Secondary | ICD-10-CM | POA: Diagnosis not present

## 2023-07-26 DIAGNOSIS — L570 Actinic keratosis: Secondary | ICD-10-CM | POA: Diagnosis not present

## 2023-07-26 DIAGNOSIS — L821 Other seborrheic keratosis: Secondary | ICD-10-CM | POA: Diagnosis not present

## 2023-07-26 DIAGNOSIS — L814 Other melanin hyperpigmentation: Secondary | ICD-10-CM | POA: Diagnosis not present

## 2023-07-26 DIAGNOSIS — L300 Nummular dermatitis: Secondary | ICD-10-CM | POA: Diagnosis not present

## 2023-07-26 DIAGNOSIS — X32XXXA Exposure to sunlight, initial encounter: Secondary | ICD-10-CM | POA: Diagnosis not present

## 2023-09-15 ENCOUNTER — Other Ambulatory Visit (HOSPITAL_BASED_OUTPATIENT_CLINIC_OR_DEPARTMENT_OTHER): Payer: Self-pay | Admitting: Family

## 2023-12-08 DIAGNOSIS — L821 Other seborrheic keratosis: Secondary | ICD-10-CM | POA: Diagnosis not present

## 2023-12-08 DIAGNOSIS — D2239 Melanocytic nevi of other parts of face: Secondary | ICD-10-CM | POA: Diagnosis not present

## 2023-12-12 DIAGNOSIS — G4763 Sleep related bruxism: Secondary | ICD-10-CM | POA: Diagnosis not present

## 2023-12-12 DIAGNOSIS — G4733 Obstructive sleep apnea (adult) (pediatric): Secondary | ICD-10-CM | POA: Diagnosis not present

## 2023-12-12 DIAGNOSIS — E782 Mixed hyperlipidemia: Secondary | ICD-10-CM | POA: Diagnosis not present

## 2023-12-18 DIAGNOSIS — N529 Male erectile dysfunction, unspecified: Secondary | ICD-10-CM | POA: Diagnosis not present

## 2023-12-18 DIAGNOSIS — N50811 Right testicular pain: Secondary | ICD-10-CM | POA: Diagnosis not present

## 2023-12-22 DIAGNOSIS — G4733 Obstructive sleep apnea (adult) (pediatric): Secondary | ICD-10-CM | POA: Diagnosis not present

## 2023-12-22 DIAGNOSIS — E782 Mixed hyperlipidemia: Secondary | ICD-10-CM | POA: Diagnosis not present

## 2023-12-22 DIAGNOSIS — I10 Essential (primary) hypertension: Secondary | ICD-10-CM | POA: Diagnosis not present

## 2023-12-22 DIAGNOSIS — Z125 Encounter for screening for malignant neoplasm of prostate: Secondary | ICD-10-CM | POA: Diagnosis not present

## 2023-12-22 DIAGNOSIS — Z23 Encounter for immunization: Secondary | ICD-10-CM | POA: Diagnosis not present

## 2023-12-22 DIAGNOSIS — Z0000 Encounter for general adult medical examination without abnormal findings: Secondary | ICD-10-CM | POA: Diagnosis not present

## 2023-12-22 DIAGNOSIS — R7303 Prediabetes: Secondary | ICD-10-CM | POA: Diagnosis not present

## 2023-12-22 DIAGNOSIS — M109 Gout, unspecified: Secondary | ICD-10-CM | POA: Diagnosis not present

## 2024-03-20 DIAGNOSIS — G4733 Obstructive sleep apnea (adult) (pediatric): Secondary | ICD-10-CM | POA: Diagnosis not present

## 2024-05-09 DIAGNOSIS — R7303 Prediabetes: Secondary | ICD-10-CM | POA: Diagnosis not present

## 2024-05-09 DIAGNOSIS — G4733 Obstructive sleep apnea (adult) (pediatric): Secondary | ICD-10-CM | POA: Diagnosis not present

## 2024-05-09 DIAGNOSIS — I10 Essential (primary) hypertension: Secondary | ICD-10-CM | POA: Diagnosis not present

## 2024-05-09 DIAGNOSIS — G4763 Sleep related bruxism: Secondary | ICD-10-CM | POA: Diagnosis not present

## 2024-06-11 DIAGNOSIS — L538 Other specified erythematous conditions: Secondary | ICD-10-CM | POA: Diagnosis not present

## 2024-06-11 DIAGNOSIS — L814 Other melanin hyperpigmentation: Secondary | ICD-10-CM | POA: Diagnosis not present

## 2024-06-11 DIAGNOSIS — D1801 Hemangioma of skin and subcutaneous tissue: Secondary | ICD-10-CM | POA: Diagnosis not present

## 2024-06-11 DIAGNOSIS — L820 Inflamed seborrheic keratosis: Secondary | ICD-10-CM | POA: Diagnosis not present

## 2024-06-11 DIAGNOSIS — L2989 Other pruritus: Secondary | ICD-10-CM | POA: Diagnosis not present

## 2024-06-11 DIAGNOSIS — L821 Other seborrheic keratosis: Secondary | ICD-10-CM | POA: Diagnosis not present

## 2024-07-01 DIAGNOSIS — G4733 Obstructive sleep apnea (adult) (pediatric): Secondary | ICD-10-CM | POA: Diagnosis not present

## 2024-07-01 DIAGNOSIS — I10 Essential (primary) hypertension: Secondary | ICD-10-CM | POA: Diagnosis not present

## 2024-07-01 DIAGNOSIS — R7303 Prediabetes: Secondary | ICD-10-CM | POA: Diagnosis not present

## 2024-07-01 DIAGNOSIS — E782 Mixed hyperlipidemia: Secondary | ICD-10-CM | POA: Diagnosis not present
